# Patient Record
Sex: Male | Born: 1955
Health system: Southern US, Community
[De-identification: ages and names within clinical notes are randomized; demographics above are authoritative.]

## PROBLEM LIST (undated history)

## (undated) DIAGNOSIS — C801 Malignant (primary) neoplasm, unspecified: Secondary | ICD-10-CM

## (undated) DIAGNOSIS — F32A Depression, unspecified: Secondary | ICD-10-CM

## (undated) DIAGNOSIS — M1990 Unspecified osteoarthritis, unspecified site: Secondary | ICD-10-CM

## (undated) DIAGNOSIS — F329 Major depressive disorder, single episode, unspecified: Secondary | ICD-10-CM

## (undated) DIAGNOSIS — E785 Hyperlipidemia, unspecified: Secondary | ICD-10-CM

## (undated) DIAGNOSIS — Z973 Presence of spectacles and contact lenses: Secondary | ICD-10-CM

## (undated) HISTORY — PX: OTHER SURGICAL HISTORY: SHX169

## (undated) HISTORY — PX: TONSILLECTOMY: SUR1361

## (undated) HISTORY — PX: COLONOSCOPY: SHX174

---

## 1999-08-27 HISTORY — PX: APPENDECTOMY: SHX54

## 2000-01-29 ENCOUNTER — Encounter: Payer: Self-pay | Admitting: Emergency Medicine

## 2000-01-29 ENCOUNTER — Inpatient Hospital Stay (HOSPITAL_COMMUNITY): Admission: EM | Admit: 2000-01-29 | Discharge: 2000-02-03 | Payer: Self-pay | Admitting: Emergency Medicine

## 2000-01-29 ENCOUNTER — Encounter: Payer: Self-pay | Admitting: Surgery

## 2000-02-01 ENCOUNTER — Encounter: Payer: Self-pay | Admitting: Surgery

## 2000-02-26 ENCOUNTER — Ambulatory Visit (HOSPITAL_COMMUNITY): Admission: RE | Admit: 2000-02-26 | Discharge: 2000-02-26 | Payer: Self-pay | Admitting: Surgery

## 2000-02-26 ENCOUNTER — Encounter: Payer: Self-pay | Admitting: Surgery

## 2000-04-30 ENCOUNTER — Inpatient Hospital Stay (HOSPITAL_COMMUNITY): Admission: EM | Admit: 2000-04-30 | Discharge: 2000-05-02 | Payer: Self-pay | Admitting: Emergency Medicine

## 2000-04-30 ENCOUNTER — Encounter (INDEPENDENT_AMBULATORY_CARE_PROVIDER_SITE_OTHER): Payer: Self-pay | Admitting: Specialist

## 2000-04-30 ENCOUNTER — Encounter: Payer: Self-pay | Admitting: Surgery

## 2000-06-16 ENCOUNTER — Emergency Department (HOSPITAL_COMMUNITY): Admission: EM | Admit: 2000-06-16 | Discharge: 2000-06-16 | Payer: Self-pay

## 2001-08-26 HISTORY — PX: MASS EXCISION: SHX2000

## 2002-01-04 ENCOUNTER — Ambulatory Visit (HOSPITAL_BASED_OUTPATIENT_CLINIC_OR_DEPARTMENT_OTHER): Admission: RE | Admit: 2002-01-04 | Discharge: 2002-01-04 | Payer: Self-pay | Admitting: Surgery

## 2002-01-04 ENCOUNTER — Encounter (INDEPENDENT_AMBULATORY_CARE_PROVIDER_SITE_OTHER): Payer: Self-pay | Admitting: Specialist

## 2003-01-20 ENCOUNTER — Encounter: Payer: Self-pay | Admitting: Emergency Medicine

## 2003-01-20 ENCOUNTER — Emergency Department (HOSPITAL_COMMUNITY): Admission: EM | Admit: 2003-01-20 | Discharge: 2003-01-20 | Payer: Self-pay | Admitting: Emergency Medicine

## 2007-08-27 HISTORY — PX: SHOULDER ARTHROSCOPY W/ ROTATOR CUFF REPAIR: SHX2400

## 2007-11-09 ENCOUNTER — Ambulatory Visit: Payer: Self-pay

## 2007-11-09 ENCOUNTER — Encounter (INDEPENDENT_AMBULATORY_CARE_PROVIDER_SITE_OTHER): Payer: Self-pay | Admitting: Family Medicine

## 2007-11-27 ENCOUNTER — Encounter: Admission: RE | Admit: 2007-11-27 | Discharge: 2007-11-28 | Payer: Self-pay | Admitting: Family Medicine

## 2007-11-27 ENCOUNTER — Ambulatory Visit: Payer: Self-pay | Admitting: Psychology

## 2008-08-11 ENCOUNTER — Ambulatory Visit (HOSPITAL_BASED_OUTPATIENT_CLINIC_OR_DEPARTMENT_OTHER): Admission: RE | Admit: 2008-08-11 | Discharge: 2008-08-11 | Payer: Self-pay | Admitting: Orthopedic Surgery

## 2011-01-08 NOTE — Op Note (Signed)
NAME:  Robert Cook, Robert Cook NO.:  [id number]   MEDICAL RECORD NO.:  [id number]          PATIENT TYPE:  AMB   LOCATION:  DSC                          FACILITY:  [facility]   PHYSICIAN:  Katy Fitch. Sypher, M.D. DATE OF BIRTH:  Oct 14, 1955   DATE OF PROCEDURE:  08/11/2008  DATE OF DISCHARGE:                               OPERATIVE REPORT   PREOPERATIVE DIAGNOSES:  Chronic weakness of abduction, right shoulder  with obvious infraspinatus muscle atrophy.  An MRI evaluation of the  shoulder x2 suggesting musculotendinous junction injury to the  infraspinatus with retracted, atrophied infraspinatus muscle.  Also  acromioclavicular degenerative arthritis, subacromial bursitis, and  clinical evaluation suggesting mild adhesive capsulitis.   POSTOPERATIVE DIAGNOSES:  Mild adhesive capsulitis, acromioclavicular  arthropathy with unfavorable medial acromial and distal clavicle  morphology, chronic denervation of infraspinatus muscle due to possible  paralabral cyst or distal suprascapular nerve entrapment now greater  than 2 years duration.   OPERATION:  1. Examination of left shoulder under anesthesia demonstrating very      mild adhesive capsulitis.  2. Diagnostic arthroscopy, left shoulder documenting an intact rotator      cuff.  3. Arthroscopic subacromial debridement with decompression of medial      acromion, extensive bursectomy, and examination of bursal surface      of rotator cuff.  4. Arthroscopic distal clavicle resection.  5. Debridement of paralabral region from 1 o'clock to 10 o'clock of      left glenoid in an effort to be certain that there was no origin of      a paralabral cyst.   OPERATING SURGEON:  Katy Fitch. Sypher, MD   ASSISTANT:  Marveen Reeks Dasnoit, PA-C   ANESTHESIA:  General endotracheal supplemented by left interscalene  block.   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  Approximately 2 years ago, he noted marked weakness of the shoulder,  loss of Power of abduction, and atrophy of his infraspinatus muscle.  He  was evaluated by Dr. Jearld Adjutant, had an MRI that documented a  probable tear of the infraspinatus, and was scheduled for diagnostic  arthroscopy and repair of his rotator cuff.   Due to a possible cardiac arrhythmia, his surgery was cancelled, and  after cardiac workup because of scheduling issues, he did not proceed  with the planned surgery.  I subsequently repaired his wife's rotator  cuff, and during our conversation, he asked me to evaluate his shoulder  predicament.  By the time we began assessing his problem, he was more  than 18 months following his initial muscle atrophy and weakness onset.  Clinically, he appeared to have significant atrophy of the infraspinatus  and prior MRI had been interpreted to read a tear of the supraspinatus  and infraspinatus tendons with atrophy of the infraspinatus.  Given the  duration of time since his index injury and imaging, we repeated an MRI  of his shoulder in September 2009.  This was interpreted  by Dr.  Frederica Kuster of [facility] to reveal a musculotendinous junction  tear of the infraspinatus, atrophy and retraction of the infraspinatus,  a normal-appearing subscap supraspinatus and teres minor muscle belly,  and mild tendinopathy of the superior subscapularis and supraspinatus.  The long head of biceps was noted be degenerative and there was noted to  be a superior labral tear.  Dr. Suzie Portela could not identify a paralabral  cyst causing compression of the terminal branches of the suprascapular  nerve.  We advised Robert Cook to consider exploration of his shoulder  in an effort to determine whether or not his infraspinatus was  reparable.  Given the duration of time from the index diagnosis of at  this point  more than 2 years, the prognosis for muscle recovery is poor.  Our thought was that we might be able to perform a partial transfer of  the supraspinatus and teres minor to rebalance the shoulder in an effort  to facilitate stabilization of the glenohumeral joint.  Preoperatively,  he was advised of the potential risks and benefits of the surgery.  He  understood that we would make decisions intraoperatively as to how to  proceed.  Questions were invited and answered in detail.   PROCEDURE:  Robert Cook was brought to the operating room and placed  in the supine position on the operating table.   Due to penicillin allergy, preoperatively he was provided 1 g of  vancomycin in a slow drip to be delivered over 90 minutes.  He was  interviewed by Dr. Sampson Goon of the Anesthesia Service, and after  informed consent, had a left interscalene block placed without  complication.  He was brought to room 8 of the [facility],  placed in supine position on the operating table and under Dr.  Jarrett Ables direct supervision, general anesthesia by endotracheal  technique induced.  He was carefully positioned in the beach-chair  position with the aid of a torso and head holder designed for shoulder  arthroscopy.  The entire left upper extremity forequarter was prepped  with DuraPrep and draped with impervious arthroscopy drapes.  The scope  was introduced through a standard posterior viewing portal using a  switching stick from an anterior approach.  Diagnostic arthroscopy  revealed a degenerative biceps that was delaminating; a degenerative  labrum without a clearly unstable type 2 slap; an intact insertion of  the subscapularis, supraspinatus, infraspinatus, and teres minor; and a  moderate degree of mature adhesive capsulitis tissue anteriorly  obliterating the subscapularis recess and the anterior superior  glenohumeral ligament.  An anterior portal was created under direct  vision  followed by meticulous debridement of the biceps, deep surface of  the supraspinatus, anterior capsule, and tenolysis of the subscapularis.  The labrum was carefully examined with a nerve hook and found to have  degenerative features and some fibrillation at its anchor; however, the  labrum was fundamentally stable.  I could not identify the mouth or neck  of a paralabral cyst.  The scope was then placed in the anterior portal  and the posterior capsule, posterior labrum, and the superior recess  posteriorly were carefully examined.  There was no sign of a retracted  rotator cuff tear.  No sign of disruption of the musculotendinous  junction of the infraspinatus.  It was apparent that there was a  significant discrepancy between the MRI impression and our findings at  surgery.  The scope was then removed from the glenohumeral joint and  placed in subacromial space.  Florid bursitis was encountered that was  removed with suction shaver.  The [facility] joint was prominent with an  osteophyte at the distal clavicle and a prominent medial acromion.  The  medial acromion was leveled to type 1 morphology.  The distal clavicle  was resected with a suction shaver 15 mm.  The supraspinatus,  infraspinatus, and teres minor were inspected in the subacromial space  and there appeared to be some rough fibrillated areas of the  musculotendinous junction of the infraspinatus.  Direct inspection of  the muscle ultimately by placing the scope into the muscle revealed a  veal-type appearance of the infraspinatus suggesting that it was  chronically denervated and atrophic.  The supraspinatus and teres minor  appeared normal.  It appears that Robert Cook has had a chronic  denervation injury of his infraspinatus leading to marked atrophy and a  false positive MRI x2.  Given the circumstances, I return to the  glenohumeral joint and meticulously debrided above and below the labrum  in an effort to correct any  perilabral cyst origin from the degenerative  labrum, but stopped short of formally decompressing the suprascapular  nerve.  While some individuals will approach the suprascapular nerve  arthroscopically, I felt in view of his long term denervation, this was  not indicated.   I formed a plan to observe his shoulder for approximately 4-6 weeks and  then obtained nerve conduction and EMG testing to look for signs of  suprascapular nerve pathology.   It is possible at a later date a direct neurolysis would be indicated.  However, given the circumstances of his prolonged denervation, it is  unlikely that we will have adequate recovery of the infraspinatus  despite decompression of the suprascapular nerve.   Past experience with individuals who do not have an infraspinatus due to  either nerve injury or tumor surgery has taught [place] that we could  rehabilitate the shoulder without an infraspinatus muscle to excellent  function with a properly structured rehab program.   We will discuss this with Robert Cook in the postoperative period.   The arthroscopic equipment was removed after hemostasis and irrigation  of the joint and subacromial space.  The portals repaired with mattress  suture of 3-0 Prolene.  There were no apparent complications.      Katy Fitch Sypher, M.D.  Electronically Signed     RVS/MEDQ  D:  08/11/2008  T:  08/12/2008  Job:  [id number]

## 2011-01-11 NOTE — Discharge Summary (Signed)
[facility]  Patient:    Robert Cook, Robert Cook                MRN: [id number] Adm. Date:  [id number] Disc. Date: [id number] Attending:  Katha Cabal                           Discharge Summary  For full details of the admission history and physical, please see the handwritten legible history.  ADMISSION DIAGNOSIS:  Ruptured appendix with abscess.  DISCHARGE DIAGNOSIS:  Ruptured appendix with abscess, status post percutaneous aspiration and antibiotic treatment.  OPERATION:  Appendectomy.  HOSPITAL COURSE:  [name] was admitted on January 29, 2000, and was begun on IV antibiotics.  He was taken downstairs for CT scan, which was drained on February 01, 2000.  This was done with just aspiration.  The patient responded nicely to that.  His temperature was 99 degrees.  He was complaining of less pain.  He was ready for discharge on February 03, 2000.  DISCHARGE MEDICATIONS:  He was given Cipro and Flagyl.  FOLLOW-UP:  He would be followed up in the office. DD:  02/25/00 TD:  02/25/00 Job: [id number]

## 2011-01-11 NOTE — Op Note (Signed)
[place]  Patient:    Robert Cook, Robert Cook Visit Number: [id number] MRN: [id number]          Service Type: DSU Location: [facility] Attending Physician:  Katha Cabal Dictated by:   [place] Daphine Deutscher, M.D. Proc. Date: 01/04/02 Admit Date:  01/04/2002   CC:         Melvyn Neth D. Clovis Riley, MD  [place] Yetta Barre, M.D.   Operative Report  PREOPERATIVE DIAGNOSIS:  Mass, right neck, increasing in size.  POSTOPERATIVE DIAGNOSIS:  Probable lipoma.  OPERATION PERFORMED:  SURGEON:  Matthew B. Daphine Deutscher, M.D.  ANESTHESIA:  General.  DESCRIPTION OF PROCEDURE:  The patient was taken to room 2 at [facility] and placed in the left lateral decubitus position after intubation.  The neck was prepped with Betadine and draped sterilely.  I excised an elliptical incision along the skin line and then with gentle pressure, helped extrude a mass that appeared to be a fairly large lipomatous mass extended inferiorly in the neck.  I used blunt dissection to tease this away and did not actually transect any structures that appeared to be neurovascular.  The patient seemed to tolerate this procedure well.  Once excised, the Bovie was used to control a little bit of oozing and the wound was closed with 4-0 Vicryl subcutaneously and subcuticularly with benzoin and Steri-Strips on the skin.  The patient was given some Vicodin for pain and will be followed up in the office in three weeks.  Path pending on this mass.  Probable lipoma. Dictated by:   [place] Daphine Deutscher, M.D. Attending Physician:  Katha Cabal DD:  01/04/02 TD:  01/05/02 Job: [id number]

## 2011-01-11 NOTE — Op Note (Signed)
[place]  Patient:    Robert Cook, Robert Cook                MRN: [id number] Proc. Date: 04/30/00 Adm. Date:  [id number] Attending:  Katha Cabal                           Operative Report  PREOPERATIVE DIAGNOSIS:  History of ruptured appendix with abscess, now with recurrent appendicitis.  POSTOPERATIVE DIAGNOSIS:  Acute recurrent appendicitis.  OPERATION PERFORMED:  Appendectomy.  SURGEON:  Th[place] Park. Daphine Deutscher, M.D.  ANESTHESIA:  General.  DESCRIPTION OF PROCEDURE:  The patient was taken to the operating room 15 on the afternoon of April 30, 2000 and given general anesthesia.  In the emergency department he had been given Cipro and Flagyl.  The abdomen was prepped with Betadine and draped sterilely.  I made a small transverse incision in the right lower quadrant and did a muscle splitting incision.  I did not have to extend this in the [facility] fashion.  I then did cultures of peritoneal fluid and mobilized the cecum.  The appendix was bound in to this inflammatory mass and I mobilized the appendix or what was left of it. It seemed to be stuck to the terminal ileum which I dissected free.  I then was able to identify the appendiceal base and then went to the mesentery with a right angle clamp and ligated this with 2-0 silk.  After isolating the base, I then was able to bring the appendix up into the wound and took the TA30 stapler and stapled across the base.  This I did since the patient did have some purulence around the base of the appendix on the cecum, although the base itself was reasonably soft.  Following stapling, the appendix was transected and the stump did not bleed.  It looked like a good secure closure.  Following removal of the appendix I then irrigated copiously and used my finger to feel in the entire right lower quadrant.  I did retrieve about a 1.5 cm little intraperitoneal body which was probably related to  his previous abscess and drainage and probably necrotic omentum.  No bleeding was seen.  I then closed the abdomen using 0 PDS closing the peritoneum in a running simple fashion. The muscle was approximated with interrupted 0 PDS and the external oblique was closed with running 0 PDS.  The wound was irrigated and the skin was closed with vertical mattress sutures of 4-0 nylon.  The patient tolerated the procedure well and was taken to the recovery room in satisfactory condition. DD:  04/30/00 TD:  05/01/00 Job: [id number]

## 2011-05-02 ENCOUNTER — Other Ambulatory Visit: Payer: Self-pay | Admitting: Dermatology

## 2011-05-30 LAB — POCT HEMOGLOBIN-HEMACUE: Hemoglobin: 15.5 g/dL (ref 13.0–17.0)

## 2013-12-31 ENCOUNTER — Other Ambulatory Visit: Payer: Self-pay | Admitting: Orthopedic Surgery

## 2013-12-31 DIAGNOSIS — M75100 Unspecified rotator cuff tear or rupture of unspecified shoulder, not specified as traumatic: Secondary | ICD-10-CM

## 2014-01-10 ENCOUNTER — Ambulatory Visit
Admission: RE | Admit: 2014-01-10 | Discharge: 2014-01-10 | Disposition: A | Discharge status: Home / Self Care | Payer: BC Managed Care – PPO | Source: Ambulatory Visit | Attending: Orthopedic Surgery | Admitting: Orthopedic Surgery

## 2014-01-10 DIAGNOSIS — M75100 Unspecified rotator cuff tear or rupture of unspecified shoulder, not specified as traumatic: Secondary | ICD-10-CM

## 2014-01-10 MED ORDER — IOHEXOL 180 MG/ML  SOLN
15.0000 mL | Freq: Once | INTRAMUSCULAR | Status: AC | PRN
  Administered 2014-01-10: 15 mL via INTRA_ARTICULAR

## 2014-01-19 ENCOUNTER — Encounter (HOSPITAL_BASED_OUTPATIENT_CLINIC_OR_DEPARTMENT_OTHER): Payer: Self-pay | Admitting: *Deleted

## 2014-01-19 NOTE — Progress Notes (Signed)
To bring all meds and overnight bag in case he has to stay 

## 2014-01-19 NOTE — Progress Notes (Signed)
01/19/14 1649  OBSTRUCTIVE SLEEP APNEA  Have you ever been diagnosed with sleep apnea through a sleep study? No  Do you snore loudly (loud enough to be heard through closed doors)?  1  Do you often feel tired, fatigued, or sleepy during the daytime? 0  Has anyone observed you stop breathing during your sleep? 0  Do you have, or are you being treated for high blood pressure? 0  BMI more than 35 kg/m2? 0  Age over 59 years old? 1  Neck circumference greater than 40 cm/16 inches? 1  Gender: 1  Obstructive Sleep Apnea Score 4  Score 4 or greater  Results sent to PCP

## 2014-01-21 ENCOUNTER — Other Ambulatory Visit: Payer: Self-pay | Admitting: Orthopedic Surgery

## 2014-01-24 NOTE — H&P (Signed)
Robert Cook is an 58 y.o. male.   Chief Complaint: c/o chronic and progressive left shoulder pain and weakness. 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  He has a very complex left shoulder predicament with a chronic atrophy of the infraspinatus, a normal supraspinatus and a normal teres minor.  He was thoroughly worked up in 2009 and underwent arthroscopy at which time we directly examined the infraspinatus and found that it was denervated and atrophied.  We performed a subacromial decompression, partial distal clavicle resection and debridement of his posterior labrum.  Postoperatively he had relief of his impingement type symptoms, but never recovered the strength of his infraspinatus.  He has had weakness of scaption abduction  since that time with notable loss of external rotation strength.  He has visible atrophy of the infraspinatus.    In September, 2014 Robert Cook was worked up with a MRI of his shoulder.  His infraspinatus atrophy remained.  He had regrown bone at the distal clavicle and now has a rather prominent osteophyte superiorly at the [facility] joint. He does not have impinging osteophytes inferiorly at the distal clavicle, but has developed some recurrent bone at the medial acromion.    Robert Cook's symptoms are not directly at the [facility] joint.  He is having impingement type symptoms.  He has difficultly at night and during the day.      Past Medical History  Diagnosis Date  . Hyperlipemia   . Depression   . Anxiety   . Wears glasses     Past Surgical History  Procedure Laterality Date  . Shoulder arthroscopy w/ rotator cuff repair  2009    left  . Tonsillectomy    . Appendectomy  2001    ruptured append-open  . Mass excision  2003    right neck  . Colonoscopy      History reviewed. No pertinent family history. Social History:  reports that he quit smoking about a year ago. He does not have any smokeless  tobacco history on file. He reports that he does not drink alcohol or use illicit drugs.  Allergies:  Allergies  Allergen Reactions  . Penicillins Rash    No prescriptions prior to admission    No results found for this or any previous visit (from the past 48 hour(s)).  No results found.   Pertinent items are noted in HPI.  Height 5\' 8"  (1.727 m), weight 86.183 kg (190 lb).  General appearance: alert Head: Normocephalic, without obvious abnormality Neck: supple, symmetrical, trachea midline Resp: clear to auscultation bilaterally Cardio: WNL GI: normal findings: bowel sounds normal Extremities: His examination at this time reveals combined elevation right shoulder 170, left shoulder 160, he externally rotates the right shoulder 90 degrees at 90 degrees abduction, left shoulder 80 degrees at 90 degrees abduction.  He can internally rotate his right thumb to T-8, left thumb T-10. He has a painless push-off test on the right and a painless push-off test on the left.  He has some pain with cross-torso adduction. He is tender with firm palpation over the [facility] joint, but this is not his primary complaint.    I reviewed his MRI in detail from September, 2014.  This is a complex predicament that we have never really adequately explained with respect to his infraspinatus pathology.    He may have some type of fascial band compressing the distal subscapularis nerve motor branch to the infraspinatus.  Repeat MRI arthrogram of his left shoulder was ordered. This was obtained at [facility] on 01-10-14. The films were interpreted by Rozetta Nunnery. His MRI reveals the chronic complete atrophy of his infraspinatus and an intact infraspinatus tendon at the site of a prior rotator cuff repair. He now has an anterior full thickness supraspinatus rotator cuff tear. He also has new bone growth at the distal clavicle leading to a prominent osteophyte at the [facility] joint Pulses: 2+ and symmetric Skin:  normal Neurologic: Grossly normal    Assessment/Plan Impression:  Left shoulder impingement with RC tear.  Chronic acromioclavicular arthrosis   Plan: To the OR for left SA with SAD/DCR and RC repair as needed.The procedure, risks,benefits and post-op course were discussed with the patient at length and they were in agreement with the plan.  Robert Cook is a well-known family acquaintance and long-term patient.  He has a very complex predicament with his left rotator cuff that dates back before 2009. We have never been able to explain a severe atrophy of his infraspinatus muscle tendon unit. This was examined with direct arthroscopy in 2009 and at that time documented to be a completely denervated muscle with complete atrophy.  Robert Cook has been living with his supraspinatus as his primary stabilizer of the glenohumeral joint on the left side  He has developed a severe chronic pain syndrome and a recent MR arthrogram completed at [place] confirmed a significant spraspinatus rotator cuff tear  His infraspinatus appeared nearly normal at its insertion however there is no muscle animating the infraspinatus tendon  Robert Cook has been living with a chronic pain predicament due to his supraspinatus impairment with the supraspinatus providing most of the superior stabilization of his glenohumeral joint.  He has required narcotic analgesics to help sleep at night. This may complicate our postoperative pain management as he may be somewhat tolerant to opioids.  He understands were proceeding at this time on a best effort basis to try to relieve his pain with further subacromial decompression, open distal clavicle resection with muscle interposition and repair of the supraspinatus  There is a small chance we'll need to address his subscapularis as personal review of the MRI suggested superior subscapularis is also partially torn with some medial subluxation of the long head of the  biceps we will provide appropriate care for the predicament defined upon direct inspection of the glenohumeral joint as well as the subacromial space diagnostic arthroscopy.  Questions have been invited and answered in detail in the office in the holding area.     Marily Lente Dasnoit 01/24/2014, 3:55 PM

## 2014-01-25 ENCOUNTER — Ambulatory Visit (HOSPITAL_BASED_OUTPATIENT_CLINIC_OR_DEPARTMENT_OTHER): Payer: BC Managed Care – PPO | Admitting: Anesthesiology

## 2014-01-25 ENCOUNTER — Ambulatory Visit (HOSPITAL_BASED_OUTPATIENT_CLINIC_OR_DEPARTMENT_OTHER)
Admission: RE | Admit: 2014-01-25 | Discharge: 2014-01-26 | Disposition: A | Discharge status: Home / Self Care | Payer: BC Managed Care – PPO | Source: Ambulatory Visit | Attending: Orthopedic Surgery | Admitting: Orthopedic Surgery

## 2014-01-25 ENCOUNTER — Encounter (HOSPITAL_BASED_OUTPATIENT_CLINIC_OR_DEPARTMENT_OTHER): Payer: BC Managed Care – PPO | Admitting: Anesthesiology

## 2014-01-25 ENCOUNTER — Encounter (HOSPITAL_BASED_OUTPATIENT_CLINIC_OR_DEPARTMENT_OTHER): Payer: Self-pay

## 2014-01-25 ENCOUNTER — Encounter (HOSPITAL_BASED_OUTPATIENT_CLINIC_OR_DEPARTMENT_OTHER)
Admission: RE | Disposition: A | Discharge status: Home / Self Care | Payer: Self-pay | Source: Ambulatory Visit | Attending: Orthopedic Surgery

## 2014-01-25 DIAGNOSIS — M719 Bursopathy, unspecified: Principal | ICD-10-CM | POA: Insufficient documentation

## 2014-01-25 DIAGNOSIS — M19019 Primary osteoarthritis, unspecified shoulder: Secondary | ICD-10-CM | POA: Insufficient documentation

## 2014-01-25 DIAGNOSIS — F411 Generalized anxiety disorder: Secondary | ICD-10-CM | POA: Insufficient documentation

## 2014-01-25 DIAGNOSIS — Z87891 Personal history of nicotine dependence: Secondary | ICD-10-CM | POA: Insufficient documentation

## 2014-01-25 DIAGNOSIS — M7580 Other shoulder lesions, unspecified shoulder: Secondary | ICD-10-CM

## 2014-01-25 DIAGNOSIS — F329 Major depressive disorder, single episode, unspecified: Secondary | ICD-10-CM | POA: Insufficient documentation

## 2014-01-25 DIAGNOSIS — M6250 Muscle wasting and atrophy, not elsewhere classified, unspecified site: Secondary | ICD-10-CM | POA: Insufficient documentation

## 2014-01-25 DIAGNOSIS — M9420 Chondromalacia, unspecified site: Secondary | ICD-10-CM | POA: Insufficient documentation

## 2014-01-25 DIAGNOSIS — M67919 Unspecified disorder of synovium and tendon, unspecified shoulder: Secondary | ICD-10-CM | POA: Insufficient documentation

## 2014-01-25 DIAGNOSIS — Z880 Allergy status to penicillin: Secondary | ICD-10-CM | POA: Insufficient documentation

## 2014-01-25 DIAGNOSIS — M75120 Complete rotator cuff tear or rupture of unspecified shoulder, not specified as traumatic: Secondary | ICD-10-CM | POA: Diagnosis present

## 2014-01-25 DIAGNOSIS — S43439A Superior glenoid labrum lesion of unspecified shoulder, initial encounter: Secondary | ICD-10-CM | POA: Insufficient documentation

## 2014-01-25 DIAGNOSIS — M6790 Unspecified disorder of synovium and tendon, unspecified site: Secondary | ICD-10-CM | POA: Insufficient documentation

## 2014-01-25 DIAGNOSIS — M25819 Other specified joint disorders, unspecified shoulder: Secondary | ICD-10-CM | POA: Insufficient documentation

## 2014-01-25 DIAGNOSIS — F3289 Other specified depressive episodes: Secondary | ICD-10-CM | POA: Insufficient documentation

## 2014-01-25 DIAGNOSIS — E785 Hyperlipidemia, unspecified: Secondary | ICD-10-CM | POA: Insufficient documentation

## 2014-01-25 HISTORY — DX: Presence of spectacles and contact lenses: Z97.3

## 2014-01-25 HISTORY — PX: SHOULDER ARTHROSCOPY WITH OPEN ROTATOR CUFF REPAIR AND DISTAL CLAVICLE ACROMINECTOMY: SHX5683

## 2014-01-25 HISTORY — DX: Depression, unspecified: F32.A

## 2014-01-25 HISTORY — DX: Major depressive disorder, single episode, unspecified: F32.9

## 2014-01-25 HISTORY — DX: Hyperlipidemia, unspecified: E78.5

## 2014-01-25 LAB — POCT HEMOGLOBIN-HEMACUE: Hemoglobin: 14.3 g/dL (ref 13.0–17.0)

## 2014-01-25 SURGERY — SHOULDER ARTHROSCOPY WITH OPEN ROTATOR CUFF REPAIR AND DISTAL CLAVICLE ACROMINECTOMY
Anesthesia: General | Site: Shoulder | Laterality: Left

## 2014-01-25 MED ORDER — HYDROMORPHONE HCL PF 1 MG/ML IJ SOLN
0.2500 mg | INTRAMUSCULAR | Status: DC | PRN
  Administered 2014-01-25 (×2): 0.5 mg via INTRAVENOUS

## 2014-01-25 MED ORDER — OXYCODONE-ACETAMINOPHEN 5-325 MG PO TABS
1.0000 | ORAL_TABLET | ORAL | Status: DC | PRN
  Administered 2014-01-25 – 2014-01-26 (×4): 2 via ORAL
  Filled 2014-01-25 (×4): qty 2

## 2014-01-25 MED ORDER — MIDAZOLAM HCL 2 MG/2ML IJ SOLN
1.0000 mg | INTRAMUSCULAR | Status: DC | PRN
  Administered 2014-01-25: 2 mg via INTRAVENOUS

## 2014-01-25 MED ORDER — ONDANSETRON HCL 4 MG/2ML IJ SOLN: 4.0000 mg | Freq: Four times a day (QID) | INTRAMUSCULAR | Status: DC | PRN

## 2014-01-25 MED ORDER — VANCOMYCIN HCL IN DEXTROSE 1-5 GM/200ML-% IV SOLN
1000.0000 mg | Freq: Two times a day (BID) | INTRAVENOUS | Status: AC
  Administered 2014-01-26: 1000 mg via INTRAVENOUS

## 2014-01-25 MED ORDER — VANCOMYCIN HCL IN DEXTROSE 1-5 GM/200ML-% IV SOLN
1000.0000 mg | INTRAVENOUS | Status: AC
  Administered 2014-01-25 (×2): 1000 mg via INTRAVENOUS

## 2014-01-25 MED ORDER — SODIUM CHLORIDE 0.9 % IR SOLN
Status: DC | PRN
  Administered 2014-01-25: 10000 mL

## 2014-01-25 MED ORDER — HYDROMORPHONE HCL 2 MG PO TABS: ORAL_TABLET | ORAL | Status: DC

## 2014-01-25 MED ORDER — PHENYLEPHRINE HCL 10 MG/ML IJ SOLN
10.0000 mg | INTRAVENOUS | Status: DC | PRN
  Administered 2014-01-25: 20 ug/min via INTRAVENOUS

## 2014-01-25 MED ORDER — LACTATED RINGERS IV SOLN
INTRAVENOUS | Status: DC
  Administered 2014-01-25 (×2): via INTRAVENOUS

## 2014-01-25 MED ORDER — DEXAMETHASONE SODIUM PHOSPHATE 4 MG/ML IJ SOLN
INTRAMUSCULAR | Status: DC | PRN
  Administered 2014-01-25: 10 mg via INTRAVENOUS

## 2014-01-25 MED ORDER — ONDANSETRON HCL 4 MG/2ML IJ SOLN: 4.0000 mg | Freq: Once | INTRAMUSCULAR | Status: DC | PRN

## 2014-01-25 MED ORDER — SUCCINYLCHOLINE CHLORIDE 20 MG/ML IJ SOLN
INTRAMUSCULAR | Status: DC | PRN
  Administered 2014-01-25: 100 mg via INTRAVENOUS

## 2014-01-25 MED ORDER — ONDANSETRON HCL 4 MG PO TABS: 4.0000 mg | ORAL_TABLET | Freq: Four times a day (QID) | ORAL | Status: DC | PRN

## 2014-01-25 MED ORDER — DOXYCYCLINE HYCLATE 100 MG PO TABS: 100.0000 mg | ORAL_TABLET | Freq: Two times a day (BID) | ORAL | Status: DC

## 2014-01-25 MED ORDER — FENTANYL CITRATE 0.05 MG/ML IJ SOLN
INTRAMUSCULAR | Status: AC
  Filled 2014-01-25: qty 6

## 2014-01-25 MED ORDER — LIDOCAINE HCL (CARDIAC) 20 MG/ML IV SOLN
INTRAVENOUS | Status: DC | PRN
  Administered 2014-01-25: 100 mg via INTRAVENOUS

## 2014-01-25 MED ORDER — PROPOFOL 10 MG/ML IV BOLUS
INTRAVENOUS | Status: DC | PRN
  Administered 2014-01-25: 200 mg via INTRAVENOUS

## 2014-01-25 MED ORDER — EPHEDRINE SULFATE 50 MG/ML IJ SOLN
INTRAMUSCULAR | Status: DC | PRN
  Administered 2014-01-25: 15 mg via INTRAVENOUS

## 2014-01-25 MED ORDER — CHLORHEXIDINE GLUCONATE 4 % EX LIQD: 60.0000 mL | Freq: Once | CUTANEOUS | Status: DC

## 2014-01-25 MED ORDER — MIDAZOLAM HCL 2 MG/2ML IJ SOLN
INTRAMUSCULAR | Status: AC
  Filled 2014-01-25: qty 2

## 2014-01-25 MED ORDER — HYDROMORPHONE HCL PF 1 MG/ML IJ SOLN
0.5000 mg | INTRAMUSCULAR | Status: DC | PRN
  Administered 2014-01-25 – 2014-01-26 (×5): 1 mg via INTRAVENOUS
  Filled 2014-01-25 (×5): qty 1

## 2014-01-25 MED ORDER — OLANZAPINE 10 MG PO TABS: 20.0000 mg | ORAL_TABLET | Freq: Every day | ORAL | Status: DC

## 2014-01-25 MED ORDER — METOCLOPRAMIDE HCL 5 MG/ML IJ SOLN: 5.0000 mg | Freq: Three times a day (TID) | INTRAMUSCULAR | Status: DC | PRN

## 2014-01-25 MED ORDER — MIDAZOLAM HCL 5 MG/5ML IJ SOLN
INTRAMUSCULAR | Status: DC | PRN
  Administered 2014-01-25: 2 mg via INTRAVENOUS

## 2014-01-25 MED ORDER — ONDANSETRON HCL 4 MG/2ML IJ SOLN
INTRAMUSCULAR | Status: DC | PRN
Start: 2014-01-25 — End: 2014-01-25
  Administered 2014-01-25: 4 mg via INTRAVENOUS

## 2014-01-25 MED ORDER — OXYCODONE HCL 5 MG PO TABS: 5.0000 mg | ORAL_TABLET | Freq: Once | ORAL | Status: DC | PRN

## 2014-01-25 MED ORDER — FENTANYL CITRATE 0.05 MG/ML IJ SOLN
INTRAMUSCULAR | Status: AC
  Filled 2014-01-25: qty 2

## 2014-01-25 MED ORDER — VANCOMYCIN HCL IN DEXTROSE 1-5 GM/200ML-% IV SOLN
INTRAVENOUS | Status: AC
  Filled 2014-01-25: qty 200

## 2014-01-25 MED ORDER — SODIUM CHLORIDE 0.9 % IV SOLN
INTRAVENOUS | Status: DC
  Administered 2014-01-25: 22:00:00 via INTRAVENOUS

## 2014-01-25 MED ORDER — METOCLOPRAMIDE HCL 5 MG PO TABS: 5.0000 mg | ORAL_TABLET | Freq: Three times a day (TID) | ORAL | Status: DC | PRN

## 2014-01-25 MED ORDER — FENTANYL CITRATE 0.05 MG/ML IJ SOLN
INTRAMUSCULAR | Status: DC | PRN
  Administered 2014-01-25: 100 ug via INTRAVENOUS
  Administered 2014-01-25 (×2): 50 ug via INTRAVENOUS

## 2014-01-25 MED ORDER — FENTANYL CITRATE 0.05 MG/ML IJ SOLN
50.0000 ug | INTRAMUSCULAR | Status: DC | PRN
  Administered 2014-01-25: 100 ug via INTRAVENOUS

## 2014-01-25 MED ORDER — OXYCODONE HCL 5 MG/5ML PO SOLN: 5.0000 mg | Freq: Once | ORAL | Status: DC | PRN

## 2014-01-25 MED ORDER — HYDROMORPHONE HCL PF 1 MG/ML IJ SOLN
INTRAMUSCULAR | Status: AC
  Filled 2014-01-25: qty 1

## 2014-01-25 SURGICAL SUPPLY — 81 items
ANCHOR BIO SWLOCK 4.75 W/TIG (Anchor) ×2 IMPLANT
ANCHOR PEEK 4.75X19.1 SWLK C (Anchor) ×6 IMPLANT
BANDAGE ADH SHEER 1  50/CT (GAUZE/BANDAGES/DRESSINGS) IMPLANT
BLADE 15 SAFETY STRL DISP (BLADE) IMPLANT
BLADE AVERAGE 25X9 (BLADE) ×2 IMPLANT
BLADE CUTTER MENIS 5.5 (BLADE) IMPLANT
BLADE SURG 15 STRL LF DISP TIS (BLADE) ×1 IMPLANT
BLADE SURG 15 STRL SS (BLADE) ×1
BUR EGG 3PK/BX (BURR) IMPLANT
BUR OVAL 6.0 (BURR) ×2 IMPLANT
CANISTER SUCT 3000ML (MISCELLANEOUS) IMPLANT
CANNULA TWIST IN 8.25X7CM (CANNULA) ×2 IMPLANT
CLEANER CAUTERY TIP 5X5 PAD (MISCELLANEOUS) ×1 IMPLANT
CUTTER MENISCUS  4.2MM (BLADE) ×1
CUTTER MENISCUS 4.2MM (BLADE) ×1 IMPLANT
DECANTER SPIKE VIAL GLASS SM (MISCELLANEOUS) IMPLANT
DRAPE INCISE IOBAN 66X45 STRL (DRAPES) ×2 IMPLANT
DRAPE STERI 35X30 U-POUCH (DRAPES) ×2 IMPLANT
DRAPE SURG 17X23 STRL (DRAPES) ×2 IMPLANT
DRAPE U-SHAPE 47X51 STRL (DRAPES) ×2 IMPLANT
DRAPE U-SHAPE 76X120 STRL (DRAPES) ×4 IMPLANT
DRSG PAD ABDOMINAL 8X10 ST (GAUZE/BANDAGES/DRESSINGS) ×2 IMPLANT
DURAPREP 26ML APPLICATOR (WOUND CARE) ×2 IMPLANT
ELECT REM PT RETURN 9FT ADLT (ELECTROSURGICAL) ×2
ELECTRODE REM PT RTRN 9FT ADLT (ELECTROSURGICAL) ×1 IMPLANT
GAUZE SPONGE 4X4 12PLY STRL (GAUZE/BANDAGES/DRESSINGS) ×2 IMPLANT
GLOVE BIO SURGEON STRL SZ 6.5 (GLOVE) ×2 IMPLANT
GLOVE BIOGEL M 7.0 STRL (GLOVE) ×2 IMPLANT
GLOVE BIOGEL M STRL SZ7.5 (GLOVE) ×2 IMPLANT
GLOVE BIOGEL PI IND STRL 7.0 (GLOVE) ×2 IMPLANT
GLOVE BIOGEL PI IND STRL 7.5 (GLOVE) ×1 IMPLANT
GLOVE BIOGEL PI IND STRL 8 (GLOVE) ×2 IMPLANT
GLOVE BIOGEL PI INDICATOR 7.0 (GLOVE) ×2
GLOVE BIOGEL PI INDICATOR 7.5 (GLOVE) ×1
GLOVE BIOGEL PI INDICATOR 8 (GLOVE) ×2
GLOVE ORTHO TXT STRL SZ7.5 (GLOVE) ×2 IMPLANT
GOWN STRL REUS W/ TWL LRG LVL3 (GOWN DISPOSABLE) ×1 IMPLANT
GOWN STRL REUS W/TWL LRG LVL3 (GOWN DISPOSABLE) ×1
GOWN STRL REUS W/TWL XL LVL4 (GOWN DISPOSABLE) ×4 IMPLANT
IV NS IRRIG 3000ML ARTHROMATIC (IV SOLUTION) ×8 IMPLANT
MANIFOLD NEPTUNE II (INSTRUMENTS) ×2 IMPLANT
NDL SUT 6 .5 CRC .975X.05 MAYO (NEEDLE) ×1 IMPLANT
NEEDLE MAYO TAPER (NEEDLE) ×1
NEEDLE MINI RC 24MM (NEEDLE) IMPLANT
NEEDLE SCORPION (NEEDLE) ×2 IMPLANT
PACK ARTHROSCOPY DSU (CUSTOM PROCEDURE TRAY) ×2 IMPLANT
PACK BASIN DAY SURGERY FS (CUSTOM PROCEDURE TRAY) ×2 IMPLANT
PAD CLEANER CAUTERY TIP 5X5 (MISCELLANEOUS) ×1
PASSER SUT SWANSON 36MM LOOP (INSTRUMENTS) IMPLANT
PENCIL BUTTON HOLSTER BLD 10FT (ELECTRODE) ×2 IMPLANT
SLEEVE SCD COMPRESS KNEE MED (MISCELLANEOUS) ×2 IMPLANT
SLING ARM LRG ADULT FOAM STRAP (SOFTGOODS) ×4 IMPLANT
SLING ARM MED ADULT FOAM STRAP (SOFTGOODS) IMPLANT
SPONGE LAP 4X18 X RAY DECT (DISPOSABLE) ×4 IMPLANT
STRIP CLOSURE SKIN 1/2X4 (GAUZE/BANDAGES/DRESSINGS) ×2 IMPLANT
SUCTION FRAZIER TIP 10 FR DISP (SUCTIONS) IMPLANT
SUT FIBERWIRE #2 38 T-5 BLUE (SUTURE)
SUT FIBERWIRE 3-0 18 TAPR NDL (SUTURE)
SUT PROLENE 1 CT (SUTURE) IMPLANT
SUT PROLENE 3 0 PS 2 (SUTURE) ×2 IMPLANT
SUT TIGER TAPE 7 IN WHITE (SUTURE) IMPLANT
SUT VIC AB 0 CT1 27 (SUTURE)
SUT VIC AB 0 CT1 27XBRD ANBCTR (SUTURE) IMPLANT
SUT VIC AB 0 SH 27 (SUTURE) ×4 IMPLANT
SUT VIC AB 2-0 SH 27 (SUTURE) ×2
SUT VIC AB 2-0 SH 27XBRD (SUTURE) ×2 IMPLANT
SUT VIC AB 3-0 SH 27 (SUTURE)
SUT VIC AB 3-0 SH 27X BRD (SUTURE) IMPLANT
SUT VIC AB 3-0 X1 27 (SUTURE) IMPLANT
SUTURE FIBERWR #2 38 T-5 BLUE (SUTURE) IMPLANT
SUTURE FIBERWR 3-0 18 TAPR NDL (SUTURE) IMPLANT
SYR 3ML 23GX1 SAFETY (SYRINGE) IMPLANT
SYR BULB 3OZ (MISCELLANEOUS) IMPLANT
TAPE FIBER 2MM 7IN #2 BLUE (SUTURE) ×4 IMPLANT
TAPE PAPER 3X10 WHT MICROPORE (GAUZE/BANDAGES/DRESSINGS) ×2 IMPLANT
TOWEL OR 17X24 6PK STRL BLUE (TOWEL DISPOSABLE) ×2 IMPLANT
TUBE CONNECTING 20X1/4 (TUBING) ×4 IMPLANT
TUBING ARTHROSCOPY IRRIG 16FT (MISCELLANEOUS) ×2 IMPLANT
WAND STAR VAC 90 (SURGICAL WAND) ×2 IMPLANT
WATER STERILE IRR 1000ML POUR (IV SOLUTION) ×2 IMPLANT
YANKAUER SUCT BULB TIP NO VENT (SUCTIONS) IMPLANT

## 2014-01-25 NOTE — Anesthesia Postprocedure Evaluation (Signed)
  Anesthesia Post-op Note  Patient: Robert Cook  Procedure(s) Performed: Procedure(s): LEFT SHOULDER ARTHROSCOPY WITH OPEN  DISTAL CLAVICLE RESECTION, OPEN ROTATOR CUFF REPAIR, OPEN BICEPS TENODESIS, ARTHROSCOPIC SUBSCAPULARIS REPAIR AND SUBACROMIAL DECOMPRESSION (Left)  Patient Location: PACU  Anesthesia Type:General  Level of Consciousness: awake  Airway and Oxygen Therapy: Patient Spontanous Breathing  Post-op Pain: mild  Post-op Assessment: Post-op Vital signs reviewed  Post-op Vital Signs: Reviewed  Last Vitals:  Filed Vitals:   01/25/14 1430  BP: 113/70  Pulse: 70  Temp: 36.1 C  Resp: 18    Complications: No apparent anesthesia complications

## 2014-01-25 NOTE — Transfer of Care (Signed)
Immediate Anesthesia Transfer of Care Note  Patient: Robert Cook  Procedure(s) Performed: Procedure(s): LEFT SHOULDER ARTHROSCOPY WITH OPEN  DISTAL CLAVICLE RESECTION, OPEN ROTATOR CUFF REPAIR, OPEN BICEPS TENODESIS, ARTHROSCOPIC SUBSCAPULARIS REPAIR AND SUBACROMIAL DECOMPRESSION (Left)  Patient Location: PACU  Anesthesia Type:General  Level of Consciousness: awake, alert , oriented and patient cooperative  Airway & Oxygen Therapy: Patient Spontanous Breathing and Patient connected to face mask oxygen  Post-op Assessment: Report given to PACU RN and Post -op Vital signs reviewed and stable  Post vital signs: Reviewed and stable  Complications: No apparent anesthesia complications

## 2014-01-25 NOTE — Progress Notes (Signed)
Assisted Dr. Crews with left, ultrasound guided, interscalene  block. Side rails up, monitors on throughout procedure. See vital signs in flow sheet. Tolerated Procedure well. 

## 2014-01-25 NOTE — Brief Op Note (Signed)
01/25/2014  1:05 PM  PATIENT:  Robert Cook  59 y.o. male  PRE-OPERATIVE DIAGNOSIS:  AC DEGENERATIVE JOINT DISEASE AND ROTATOR CUFF TEAR LEFT SHOULDER  POST-OPERATIVE DIAGNOSIS:  AC DEGENERATIVE JOINT DISEASE AND ROTATOR CUFF TEAR LEFT SHOULDER,  TYPE 3 SLAP LESION WITH UNSTABLE BICEPS LONG HEAD  PROCEDURE:  Procedure(s): LEFT SHOULDER ARTHROSCOPY WITH OPEN  DISTAL CLAVICLE RESECTION, OPEN ROTATOR CUFF REPAIR, OPEN BICEPS TENODESIS, ARTHROSCOPIC SUBSCAPULARIS REPAIR AND SUBACROMIAL DECOMPRESSION (Left)  SURGEON:  Surgeon(s) and Role:    * Cammie Sickle, MD - Primary  PHYSICIAN ASSISTANT:   ASSISTANTS: Kathyrn Sheriff.A-C    ANESTHESIA:   general  EBL:  Total I/O In: 1000 [I.V.:1000] Out: -   BLOOD ADMINISTERED:none  DRAINS: none   LOCAL MEDICATIONS USED:  .5% ROPIVACAINE SCALENE BLOCK  SPECIMEN:  No Specimen  DISPOSITION OF SPECIMEN:  N/A  COUNTS:  YES  TOURNIQUET:  * No tourniquets in log *  DICTATION: .Other Dictation: Dictation Number [id number]  PLAN OF CARE: Admit for overnight observation  PATIENT DISPOSITION:  PACU - hemodynamically stable.   Delay start of Pharmacological VTE agent (>24hrs) due to surgical blood loss or risk of bleeding: not applicable

## 2014-01-25 NOTE — Discharge Instructions (Addendum)
[facility] Instructions °Hand Surgery ° °Wound Care: °Keep your hand elevated above the level of your heart.  Do not allow it to dangle by your side.  Keep the dressing dry and do not remove it unless your doctor advises you to do so.  He will usually change it at the time of your post-op visit.  Moving your fingers is advised to stimulate circulation but will depend on the site of your surgery.  If you have a splint applied, your doctor will advise you regarding movement. ° °Activity: °Do not drive or operate machinery today.  Rest today and then you may return to your normal activity and work as indicated by your physician. ° °Diet:  °Drink liquids today or eat a light diet.  You may resume a regular diet tomorrow.   ° °General expectations: °Pain for two to three days. °Fingers may become slightly swollen. ° °Call your doctor if any of the following occur: °Severe pain not relieved by pain medication. °Elevated temperature. °Dressing soaked with blood. °Inability to move fingers. °White or bluish color to fingers. ° ° °Regional Anesthesia Blocks ° °1. Numbness or the inability to move the "blocked" extremity may last from 3-48 hours after placement. The length of time depends on the medication injected and your individual response to the medication. If the numbness is not going away after 48 hours, call your surgeon. ° °2. The extremity that is blocked will need to be protected until the numbness is gone and the  Strength has returned. Because you cannot feel it, you will need to take extra care to avoid injury. Because it may be weak, you may have difficulty moving it or using it. You may not know what position it is in without looking at it while the block is in effect. ° °3. For blocks in the legs and feet, returning to weight bearing and walking needs to be done carefully. You will need to wait until the numbness is entirely gone and the strength has returned. You should be able to move your leg and foot  normally before you try and bear weight or walk. You will need someone to be with you when you first try to ensure you do not fall and possibly risk injury. ° °4. Bruising and tenderness at the needle site are common side effects and will resolve in a few days. ° °5. Persistent numbness or new problems with movement should be communicated to the surgeon or the [facility] ([phone])/ [facility] ([phone]). ° ° °Post Anesthesia Home Care Instructions ° °Activity: °Get plenty of rest for the remainder of the day. A responsible adult should stay with you for 24 hours following the procedure.  °For the next 24 hours, DO NOT: °-Drive a car °-Operate machinery °-Drink alcoholic beverages °-Take any medication unless instructed by your physician °-Make any legal decisions or sign important papers. ° °Meals: °Start with liquid foods such as gelatin or soup. Progress to regular foods as tolerated. Avoid greasy, spicy, heavy foods. If nausea and/or vomiting occur, drink only clear liquids until the nausea and/or vomiting subsides. Call your physician if vomiting continues. ° °Special Instructions/Symptoms: °Your throat may feel dry or sore from the anesthesia or the breathing tube placed in your throat during surgery. If this causes discomfort, gargle with warm salt water. The discomfort should disappear within 24 hours. ° °

## 2014-01-25 NOTE — Anesthesia Preprocedure Evaluation (Addendum)
Anesthesia Evaluation  Patient identified by MRN, date of birth, ID band Patient awake    Reviewed: Allergy & Precautions, H&P , NPO status , Patient's Chart, lab work & pertinent test results  Airway Mallampati: I TM Distance: >3 FB Neck ROM: Full    Dental  (+) Teeth Intact, Dental Advisory Given   Pulmonary former smoker,  breath sounds clear to auscultation        Cardiovascular Exercise Tolerance: Good negative cardio ROS  Rhythm:Regular Rate:Normal  [facility] [address] [place], [place] [address]        [phone]    ---------------------------------------------------------------   Transthoracic Echocardiogram  Patient: Robert Cook MR Number: Study Date: 09-Nov-2007    ---------------------------------------------------------------  Gender:  Age:  DOB:  Height:  Weight:  BSA:  Pt. Status:  Room:    STAFF  PERFORMING   Office [place]  ORDERING     [place]    [facility] [place]    ---------------------------------------------------------------   INDICATIONS:  Assess left ventricular function. Evaluate for regional wall motion  abnormalities. Evaluate suspected valvular heart disease.  Preoperative evaluation.   DIAGNOSES SUPPORTING MEDICAL NECESSITY:  Abnormal EKG 794.31 PVCs 427.69   HISTORY:  Patient noted to have asymptomatic bradycardia with runs of bigeminy  and frequent PVCs during preoperative evaluation for surgery. The  patient had family history of coronary artery disease and cigarette  use (greater than one month prior to this admission). Abnormal  ECG-Frequent PVCs and occasional runs of bigeminy.    ---------------------------------------------------------------   PROCEDURE INFORMATION:  A transthoracic complete 2D study was performed. Additional  evaluation included M-mode, complete spectral Doppler, and color  Doppler. This was a routine echocardiographic study. The study was  performed in the [facility]. Image quality was adequate.    ---------------------------------------------------------------   2D Measurements  LEFT VENTRICLE                      NORMAL  LVID ed (chordal)     42    mm      36-56 mm  LVID es (chordal)     31    mm      --  FS (chordal)          26    %       28-44%  IVS ed                11    mm      6-11 mm  LVPW ed               9     mm      6-11 mm  AORTA                               NORMAL  AoD (root)            36    mm      <70mm  LEFT ATRIUM                         NORMAL  LAD                   40    mm      19-49mm  LAD index (A-P)       2     cm/m^2  <2.2 cm/m^2   Doppler measurements  MITRAL VALVE                        NORMAL  Peak E velocity       83.3  cm/sec  --  Peak A velocity       46.3  cm/sec  --  MV peak E/A           1.8           --  MV deceleration time  183   msec    150-230 msec  Peak gradient         3     mmHg    --    ---------------------------------------------------------------   LEFT VENTRICLE:  -  Left ventricular size was normal.  -  Overall left ventricular systolic function was normal.  -  Left ventricular ejection fraction was estimated , range being 60        % to 65 %.  -  There were no left ventricular regional wall motion        abnormalities.  -  Left ventricular wall thickness was normal   Neuro/Psych PSYCHIATRIC DISORDERS Anxiety Depression    GI/Hepatic negative GI ROS, Neg liver ROS,   Endo/Other  negative endocrine ROS  Renal/GU negative Renal ROS     Musculoskeletal  (+) Arthritis -, Osteoarthritis,    Abdominal   Peds  Hematology negative hematology ROS (+)   Anesthesia Other Findings   Reproductive/Obstetrics                        Anesthesia Physical Anesthesia Plan  ASA: II  Anesthesia Plan:  General   Post-op Pain Management:    Induction: Intravenous  Airway Management Planned: Oral ETT  Additional Equipment:   Intra-op Plan:   Post-operative Plan: Extubation in OR  Informed Consent: I have reviewed the patients History and Physical, chart, labs and discussed the procedure including the risks, benefits and alternatives for the proposed anesthesia with the patient or authorized representative who has indicated his/her understanding and acceptance.   Dental advisory given  Plan Discussed with: CRNA, Anesthesiologist and Surgeon  Anesthesia Plan Comments:        Anesthesia Quick Evaluation

## 2014-01-25 NOTE — Anesthesia Procedure Notes (Addendum)
Anesthesia Regional Block:  Interscalene brachial plexus block  Pre-Anesthetic Checklist: ,, timeout performed, Correct Patient, Correct Site, Correct Laterality, Correct Procedure, Correct Position, site marked, Risks and benefits discussed,  Surgical consent,  Pre-op evaluation,  At surgeon's request and post-op pain management  Laterality: Left and Upper  Prep: chloraprep       Needles:  Injection technique: Single-shot  Needle Type: Echogenic Needle     Needle Length: 5cm 5 cm Needle Gauge: 21 and 21 G    Additional Needles:  Procedures: ultrasound guided (picture in chart) Interscalene brachial plexus block Narrative:  Start time: 01/25/2014 10:05 AM End time: 01/25/2014 10:11 AM Injection made incrementally with aspirations every 5 mL.  Performed by: Personally  Anesthesiologist: Lorrene Reid, MD   Procedure Name: Intubation Date/Time: 01/25/2014 10:44 AM Performed by: Wanita Chamberlain Pre-anesthesia Checklist: Patient identified, Timeout performed, Emergency Drugs available, Suction available and Patient being monitored Patient Re-evaluated:Patient Re-evaluated prior to inductionOxygen Delivery Method: Circle system utilized Preoxygenation: Pre-oxygenation with 100% oxygen Intubation Type: IV induction Ventilation: Mask ventilation without difficulty Laryngoscope Size: Mac and 3 Grade View: Grade I Tube type: Oral Tube size: 8.0 mm Number of attempts: 1 Airway Equipment and Method: Stylet Placement Confirmation: ETT inserted through vocal cords under direct vision,  breath sounds checked- equal and bilateral and positive ETCO2 Secured at: 22 cm Tube secured with: Tape Dental Injury: Teeth and Oropharynx as per pre-operative assessment

## 2014-01-25 NOTE — Op Note (Signed)
[id number] 

## 2014-01-26 MED ORDER — ROPIVACAINE HCL 5 MG/ML IJ SOLN
INTRAMUSCULAR | Status: DC | PRN
  Administered 2014-01-25: 22 mL via PERINEURAL

## 2014-01-26 MED ORDER — VANCOMYCIN HCL IN DEXTROSE 1-5 GM/200ML-% IV SOLN
INTRAVENOUS | Status: AC
  Filled 2014-01-26: qty 200

## 2014-01-26 NOTE — Addendum Note (Signed)
Addendum created 01/26/14 0746 by Napoleon Form, MD   Modules edited: Anesthesia Medication Administration

## 2014-01-26 NOTE — Addendum Note (Signed)
Addendum created 01/26/14 0745 by Aldona Bar   Modules edited: Charges VN

## 2014-01-26 NOTE — Op Note (Signed)
NAME:  Robert Cook,              ACCOUNT NO.:  [id number]  MEDICAL RECORD NO.:  [id number]  LOCATION:                                 FACILITY:  PHYSICIAN:  Youlanda Mighty. Aerianna Losey, M.D.      DATE OF BIRTH:  DATE OF PROCEDURE:  01/25/2014 DATE OF DISCHARGE:                              OPERATIVE REPORT   PREOPERATIVE DIAGNOSES:  Chronic left shoulder pain and weakness with background history of more than 7 years complete atrophy left infraspinatus muscle and new onset pain consistent with rotator cuff tear, status post MR arthrogram documenting a full-thickness supraspinatus rotator cuff tear.  POSTOPERATIVE DIAGNOSIS: 1. Full-thickness supraspinatus rotator cuff tear. 2. Grade 2 subscapularis retracting rotator cuff tear. 3. Type 3 SLAP lesion with unstable biceps origin. 4. Regrowth of distal clavicle leading to impingement and     acromioclavicular joint, status post prior subacromial     decompression of the distal clavicle resection performed in 2009.  OPERATION: 1. Diagnostic arthroscopy, left glenohumeral joint. 2. Open resection of distal centimeter of clavicle with repair of     trapezius and anterior third of deltoid, closing dead space created     by distal clavicle resection. 3. Arthroscopic evaluation of the glenohumeral joint with labral     debridement, biceps tenotomy, debridement of supraspinatus and     subscapularis rotator cuff tears. 4. Arthroscopic repair of subscapularis. 5. Open biceps tenodesis suprapectoral. 6. Repair of supraspinatus rotator cuff tendon with double row     technique utilizing a medial PEEK swivel lock and 2 lateral swivel     locks with double diamondback technique.  OPERATING SURGEON:  Youlanda Mighty. Ruston Fedora, M.D.  ASSISTANT:  Marily Lente Dasnoit, PA-C  ANESTHESIA:  General endotracheal supplemented by a ropivacaine preoperative interscalene block.  SUPERVISING ANESTHESIOLOGIST:  Lorrene Reid, M.D.  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  He had, had extensive care of a painful shoulder by Dr. Lajean Silvius and in 2009, sought a consultation regarding an atypical pain predicament involving his left shoulder.  We ultimately identified atrophy of his infraspinatus rotator cuff tendon, a large perilabral cyst of his left glenoid and degenerative arthritis of the AC joint.  We proceeded with arthroscopic evaluation of his left shoulder in December 2009, demonstrating mild adhesive capsulitis, he had an intact rotator cuff at that time.  We performed a subacromial decompression of the medial acromion bursectomy and arthroscopic resection of the distal clavicle. Mr. Stowers had a significant paralabral cyst from 1 o'clock to 10 o'clock of the left glenoid and this was extensively debrided.  We carefully examined the infraspinatus muscle directly with the arthroscope at that time and identified complete denervation and atrophy of the infraspinatus despite an intact rotator cuff.  Neither Dr. Eddie Dibbles nor myself was ever able to explain the etiology of the severe infraspinatus atrophy.  Mr. Cannedy went onto have several years of excellent pain relief, but then returned for followup evaluation in August 2014, reporting increasing left shoulder pain.  We at that time, noted signs of impingement, possible onset of rotator cuff tearing and sent him for  an MRI of the shoulder at [facility] on May 07, 2013.  At that time, he was noted to have advanced supraspinatus tendinosis.  No sign of retracted rotator cuff tear, recurrence of an osteophyte at the distal clavicle with arthrosis, considerable subdeltoid bursal fluid and infraspinatus muscle atrophy thought by the attending radiologist to be probably due to a sequela of Parsonage-Turner syndrome.  Mr. Forbush had subacromial steroid injection therapy and activity modification.   He was unable to become comfortable, became increasingly weak and impaired at work and with his household activities.  He was requiring narcotic analgesics to sleep comfortably at night, and therefore was sent for a followup MR arthrogram obtained on Jan 10, 2014, documenting an essentially full-thickness supraspinatus rotator cuff tear that was not visible on a plain MRI without intra-articular MRI dye.  The attending radiologist, Dr. Zigmund Daniel did not identify a labral or biceps pathology at the time of the arthrogram.  However, this is performed in the neutral position.  Mr. Garman was advised to proceed with diagnostic arthroscopy of the left glenohumeral joint  anticipating resection of the distal clavicle, repair of the supraspinatus and addressing the biceps labral pathology and other tendon pathology identified.  I personally reviewed his MR arthrogram and felt that the subscapularis had at least a grade 2 degenerative tear.  Preoperatively, he was reminded of his potential risks and benefits of surgery.  He understands he will need to be in a sling protecting his repair for 6 weeks and then will engage in therapy.  He understands that we cannot guarantee the complete pain relief, we cannot prevent further degeneration of his rotator cuff.  He is at slight risk for development of infection or perianesthetic complication.  Questions were invited and answered in detail.  Due to PENICILLIN allergy, we anticipate using vancomycin as a perioperative prophylactic antibiotic.  PROCEDURE:  Abou Sterkel was interviewed in the holding area and his left arm and shoulder marked as the proper surgical site with a marking pen per protocol.  He was evaluated by Dr. Al Corpus of the anesthesia team and had detailed anesthesia informed consent.  He was advised to proceed with general anesthesia by endotracheal technique supplemented by a ropivacaine plexus block.  Dr. Al Corpus placed  the ropivacaine plexus block at interscalene level preoperatively with ultrasound guidance.  This led to excellent anesthesia of the left arm and forequarter.  Questions were invited and answered in detail in the holding area followed by transferring Mr. Davis to room 2 of the [facility].  Under Dr. Al Corpus' direct supervision, general anesthesia by endotracheal technique was induced followed by careful position in the beach-chair position with the aid of a torso and head holder designed for shoulder arthroscopy.  A warming blanket was employed as were passive compression devices to prevent deep vein thrombosis on the calves.  All bony prominences were padded and great care was taken to assure that there were no tight bands around his waist or mid section including his disposable clothing worn during arthroscopy. The entire left upper extremity and forequarter were prepped with DuraPrep and draped with impervious arthroscopy drapes.  The procedure commenced with a routine surgical time-out followed by a short 2 cm incision over the palpable distal clavicle osteophyte. Subcutaneous tissues were carefully divided, incising the raphe between the deltoid and trapezius muscles.  Subperiosteal dissection of the distal 1.5 cm of clavicle allowed placement of the baby Bennett retractors followed by use of an oscillating saw to resect  the distal centimeter of clavicle.  There was some meniscus-type material that had reformed at this site and this was removed with rongeur dissection. The clavicle was removed and bony prominences were palpated.  There was a small osteophyte present posteriorly that we will relieve arthroscopically.  After the completion of the distal clavicle resection, we then repaired the deltoid to the trapezius muscle with multiple figure-of-eight sutures of 0 Vicryl.  A watertight closure was obtained, followed by repair of the skin with subcutaneous 2-0 Vicryl  and intradermal 3-0 Prolene with Steri-Strips.  We then placed the arthroscope through a standard posterior viewing portal using an anterior switching stick technique.  Diagnostic arthroscopy immediately revealed a type 3 SLAP lesion with a grossly unstable biceps origin.  A grade 2 tear of the subscapularis that was quite degenerative.  A very degenerative long head of the biceps was delaminating and a full-thickness supraspinatus rotator cuff tear.  The infraspinatus insertion was intact.  The teres minor was intact.  There was minor chondromalacia of the humeral head and grade 2 chondromalacia of the central glenoid.  All the pathology was documented with the digital camera followed by creation of an anterior portal and debridement of the subscapularis and decortication of the lesser tuberosity footprint with a 4.2 mm suction shaver.  We subsequently debrided the deep surface of the supraspinatus with a suction shaver and placed an anterior superior lateral cannula.  We used a scorpion suture passer to place the mattress stitch of fiber tape through the upper third of the subscapularis and subsequently performed a swivel lock repair to an anatomic footprint for the subscapularis. The long head of the biceps was released with a cutting cautery from the labrum and the redundant labrum and adhesions were debrided with a suction shaver.  Hemostasis was achieved with bipolar cautery.  We then entered the subacromial space and performed a bursectomy, release of the coracoacromial ligament and leveling of the acromion anteriorly to type 1 morphology.  A small medial osteophyte was leveled with the suction shaver at the [facility] joint.  Hemostasis was achieved with the bipolar cautery.  We documented several areas of severe abrasion of the rotator cuff due to the anterior osteophyte.  We then removed the arthroscopic equipment and performed a 2.5 cm muscle-splitting incision between the anterior /  middle thirds of the deltoid.  We identified the full-thickness tear.  We opened this slightly to allow capture of the long head of the biceps.  We decorticated the greater tuberosity with the suction shaver, placed a 4 grasp Krackow-type fibertape stitch in the long head of the biceps that was then passed with a Mayo needle through the anterior supraspinatus.  We placed a medial swivel lock at the anatomic footprint adjacent to the hyaline cartilage of the humeral head, and placed a mattress suture of #2 FiberWire posteriorly. Two passes of the fiber tape anteriorly creating a mattress stitch at the anterior supraspinatus and subsequently performed a double diamondback repair with excellent profile to 2 lateral swivel locks. The scope was replaced in the glenohumeral joint and a very satisfactory footprint of the repair of both the subscapularis and the supraspinatus was documented with the digital camera.  We subsequently irrigated the joint thoroughly to remove all clot and debris, and subsequently irrigated the joint simply as a cleansing maneuver.  The scope was removed and the portals were repaired with intradermal 3-0 Prolene and Steri-Strips and the incision repaired with mattress suture of 0 Vicryl closing the deltoid  split, followed by subcutaneous 2-0 Vicryl and intradermal 3-0 Prolene with Steri-Strips.  Mr. Wilmeth tolerated the surgery and anesthesia well.  He was transferred to the recovery room in a sling with stable vital signs.  We will admit him to the recovery care center for observation of his vital signs and appropriate antibiotic therapy with vancomycin IV 1 g in the morning followed by doxycycline 100 mg p.o. b.i.d. x4 days.  He will be provided p.o. and IV Dilaudid as a postoperative analgesic as needed.     Youlanda Mighty Espen Bethel, M.D.     RVS/MEDQ  D:  01/25/2014  T:  01/26/2014  Job:  [id number]

## 2014-01-27 ENCOUNTER — Encounter (HOSPITAL_BASED_OUTPATIENT_CLINIC_OR_DEPARTMENT_OTHER): Payer: Self-pay | Admitting: Orthopedic Surgery

## 2014-09-22 ENCOUNTER — Other Ambulatory Visit: Payer: Self-pay | Admitting: Dermatology

## 2015-06-22 ENCOUNTER — Ambulatory Visit: Payer: Self-pay | Admitting: Surgery

## 2015-06-22 NOTE — H&P (Signed)
Robert Cook 06/22/2015 2:03 PM Location: [facility] Patient #: [id number] DOB: 02-26-1956 Married / Language: English / Race: White Male  History of Present Illness Rodman Key B. Hassell Done MD; 06/22/2015 2:52 PM) The patient is a 59 year old male Robert Cook) who presents with a colonic polyp. The patient was referred by a gastroenterologist (Dr. Cristina Gong). He underwent a colonoscopy and was found to have a flat large soft fleshy mass in the cecum which the biopsy proved to be an adenomatous change with high-grade dysplasia but no evidence of any invasive cancer. He was referred by Dr. Cristina Gong for partial colectomy to remove this.  I reviewed his records with him and his wife. Both of them have been my patients in the past having done an appendectomy on Robert Cook and a lap Nissen on Robert Cook. I gave them a booklet on colon resections and explained the procedure in some detail including risk of leaks and bleeding. I think I would do a laparoscopically assisted right hemicolectomy. He would like to do this after Thanksgiving. We'll go ahead and see about getting this set up. Will give him an oral bowel prep.    Other Problems Robert Cook, CMA; 06/22/2015 2:04 PM) No pertinent past medical history  Past Surgical History Robert Cook, CMA; 06/22/2015 2:04 PM) Colon Polyp Removal - Colonoscopy Knee Surgery Bilateral. Oral Surgery Shoulder Surgery Left.  Diagnostic Studies History Robert Cook, [place]; 06/22/2015 2:04 PM) Colonoscopy within last year  Allergies Robert Cook, [place]; 06/22/2015 2:04 PM) No Known Drug Allergies 06/22/2015  Medication History Robert Cook, CMA; 06/22/2015 2:05 PM) ALPRAZolam (0.5MG  Tablet, Oral) Active. Pravastatin Sodium (40MG  Tablet, Oral) Active. FLUoxetine HCl (20MG  Capsule, Oral) Active. Medications Reconciled  Social History Robert Cook, [place]; 06/22/2015 2:04 PM) No alcohol use No drug use Tobacco use Current  every day smoker.  Family History Robert Cook, [place]; 06/22/2015 2:04 PM) Alcohol Abuse Brother, Father, Mother. Breast Cancer Mother. Heart Disease Father. Melanoma Sister. Respiratory Condition Mother.     Review of Systems Robert Cook CMA; 06/22/2015 2:04 PM) General Not Present- Appetite Loss, Chills, Fatigue, Fever, Night Sweats, Weight Gain and Weight Loss. Skin Not Present- Change in Wart/Mole, Dryness, Hives, Jaundice, New Lesions, Non-Healing Wounds, Rash and Ulcer. HEENT Not Present- Earache, Hearing Loss, Hoarseness, Nose Bleed, Oral Ulcers, Ringing in the Ears, Seasonal Allergies, Sinus Pain, Sore Throat, Visual Disturbances, Wears glasses/contact lenses and Yellow Eyes. Respiratory Not Present- Bloody sputum, Chronic Cough, Difficulty Breathing, Snoring and Wheezing. Breast Not Present- Breast Mass, Breast Pain, Nipple Discharge and Skin Changes. Cardiovascular Not Present- Chest Pain, Difficulty Breathing Lying Down, Leg Cramps, Palpitations, Rapid Heart Rate, Shortness of Breath and Swelling of Extremities. Gastrointestinal Not Present- Abdominal Pain, Bloating, Bloody Stool, Change in Bowel Habits, Chronic diarrhea, Constipation, Difficulty Swallowing, Excessive gas, Gets full quickly at meals, Hemorrhoids, Indigestion, Nausea, Rectal Pain and Vomiting. Musculoskeletal Present- Joint Pain. Not Present- Back Pain, Joint Stiffness, Muscle Pain, Muscle Weakness and Swelling of Extremities. Neurological Not Present- Decreased Memory, Fainting, Headaches, Numbness, Seizures, Tingling, Tremor, Trouble walking and Weakness. Psychiatric Present- Anxiety. Not Present- Bipolar, Change in Sleep Pattern, Depression, Fearful and Frequent crying. Endocrine Not Present- Cold Intolerance, Excessive Hunger, Hair Changes, Heat Intolerance, Hot flashes and New Diabetes. Hematology Not Present- Easy Bruising, Excessive bleeding, Gland problems, HIV and Persistent Infections.  Vitals  Robert Cook CMA; 06/22/2015 2:06 PM) 06/22/2015 2:05 PM Weight: 194.2 lb Height: 68in Body Surface Area: 2.02 m Body Mass Index: 29.53 kg/m  Temp.: 79F  Pulse: 80 (  Regular)  BP: 152/86 (Sitting, Left Arm, Standard)      Physical Exam (Rita Prom B. Hassell Done MD; 06/22/2015 2:54 PM)  The physical exam findings are as follows: Note:Head normocephalic ENT unremarkable except for glasses Neck no bruits or masses Chest clear to auscultation Heart sinus rhythm without murmurs or gallops Abdomen no palpable masses. I went over the operation during the examination as well. Extremity exam for range of motion without cyanosis edema or clubbing. No history of DVT Neuro alert and oriented 3. Motor and sensory function grossly intact.    Assessment & Plan Rodman Key B. Hassell Done MD; 06/22/2015 2:55 PM)  POLYP OF ASCENDING COLON (D12.2) Impression: Cecal adenomatous polyp with high grade dysplasia. Plan lap assisted right hemicolectomy  Current Plans Pt Education - Colonic Polyps: discussed with patient and provided information.

## 2015-08-07 NOTE — Patient Instructions (Signed)
GEROLD WEIGMAN  08/07/2015   Your procedure is scheduled on: 12/16/  Report to [facility] Main  Entrance take [facility]  elevators to 3rd floor to  [place] at    1150 AM.  Call this number if you have problems the morning of surgery [phone]   Remember: ONLY 1 PERSON MAY GO WITH YOU TO SHORT STAY TO GET  READY MORNING OF [place].  Do not eat food or drink liquids :After Midnight.     Take these medicines the morning of surgery with A SIP OF WATER: prozac DO NOT TAKE ANY DIABETIC MEDICATIONS DAY OF YOUR SURGERY                               You may not have any metal on your body including hair pins and              piercings  Do not wear jewelry,  lotions, powders or perfumes, deodorant                          Men may shave face and neck.   Do not bring valuables to the hospital. [place].  Contacts, dentures or bridgework may not be worn into surgery.  Leave suitcase in the car. After surgery it may be brought to your room.      :  Special Instructions: coughing and deep breathing exercises , leg exercises               Please read over the following fact sheets you were given: _____________________________________________________________________             [facility] - Preparing for Surgery Before surgery, you can play an important role.  Because skin is not sterile, your skin needs to be as free of germs as possible.  You can reduce the number of germs on your skin by washing with CHG (chlorahexidine gluconate) soap before surgery.  CHG is an antiseptic cleaner which kills germs and bonds with the skin to continue killing germs even after washing. Please DO NOT use if you have an allergy to CHG or antibacterial soaps.  If your skin becomes reddened/irritated stop using the CHG and inform your nurse when you arrive at Short Stay. Do not shave (including legs and underarms) for at  least 48 hours prior to the first CHG shower.  You may shave your face/neck. Please follow these instructions carefully:  1.  Shower with CHG Soap the night before surgery and the  morning of Surgery.  2.  If you choose to wash your hair, wash your hair first as usual with your  normal  shampoo.  3.  After you shampoo, rinse your hair and body thoroughly to remove the  shampoo.                           4.  Use CHG as you would any other liquid soap.  You can apply chg directly  to the skin and wash                       Gently with a  scrungie or clean washcloth.  5.  Apply the CHG Soap to your body ONLY FROM THE NECK DOWN.   Do not use on face/ open                           Wound or open sores. Avoid contact with eyes, ears mouth and genitals (private parts).                       Wash face,  Genitals (private parts) with your normal soap.             6.  Wash thoroughly, paying special attention to the area where your surgery  will be performed.  7.  Thoroughly rinse your body with warm water from the neck down.  8.  DO NOT shower/wash with your normal soap after using and rinsing off  the CHG Soap.                9.  Pat yourself dry with a clean towel.            10.  Wear clean pajamas.            11.  Place clean sheets on your bed the night of your first shower and do not  sleep with pets. Day of Surgery : Do not apply any lotions/deodorants the morning of surgery.  Please wear clean clothes to the hospital/surgery center.  FAILURE TO FOLLOW THESE INSTRUCTIONS MAY RESULT IN THE CANCELLATION OF YOUR SURGERY PATIENT SIGNATURE_________________________________  NURSE SIGNATURE__________________________________  ________________________________________________________________________  WHAT IS A BLOOD TRANSFUSION? Blood Transfusion Information  A transfusion is the replacement of blood or some of its parts. Blood is made up of multiple cells which provide different functions.  Red  blood cells carry oxygen and are used for blood loss replacement.  White blood cells fight against infection.  Platelets control bleeding.  Plasma helps clot blood.  Other blood products are available for specialized needs, such as hemophilia or other clotting disorders. BEFORE THE TRANSFUSION  Who gives blood for transfusions?   Healthy volunteers who are fully evaluated to make sure their blood is safe. This is blood bank blood. Transfusion therapy is the safest it has ever been in the practice of medicine. Before blood is taken from a donor, a complete history is taken to make sure that person has no history of diseases nor engages in risky social behavior (examples are intravenous drug use or sexual activity with multiple partners). The donor's travel history is screened to minimize risk of transmitting infections, such as malaria. The donated blood is tested for signs of infectious diseases, such as HIV and hepatitis. The blood is then tested to be sure it is compatible with you in order to minimize the chance of a transfusion reaction. If you or a relative donates blood, this is often done in anticipation of surgery and is not appropriate for emergency situations. It takes many days to process the donated blood. RISKS AND COMPLICATIONS Although transfusion therapy is very safe and saves many lives, the main dangers of transfusion include:   Getting an infectious disease.  Developing a transfusion reaction. This is an allergic reaction to something in the blood you were given. Every precaution is taken to prevent this. The decision to have a blood transfusion has been considered carefully by your caregiver before blood is given. Blood is not given unless the benefits outweigh the risks. AFTER  THE TRANSFUSION  Right after receiving a blood transfusion, you will usually feel much better and more energetic. This is especially true if your red blood cells have gotten low (anemic). The  transfusion raises the level of the red blood cells which carry oxygen, and this usually causes an energy increase.  The nurse administering the transfusion will monitor you carefully for complications. HOME CARE INSTRUCTIONS  No special instructions are needed after a transfusion. You may find your energy is better. Speak with your caregiver about any limitations on activity for underlying diseases you may have. SEEK MEDICAL CARE IF:   Your condition is not improving after your transfusion.  You develop redness or irritation at the intravenous (IV) site. SEEK IMMEDIATE MEDICAL CARE IF:  Any of the following symptoms occur over the next 12 hours:  Shaking chills.  You have a temperature by mouth above 102 F (38.9 C), not controlled by medicine.  Chest, back, or muscle pain.  People around you feel you are not acting correctly or are confused.  Shortness of breath or difficulty breathing.  Dizziness and fainting.  You get a rash or develop hives.  You have a decrease in urine output.  Your urine turns a dark color or changes to pink, red, or brown. Any of the following symptoms occur over the next 10 days:  You have a temperature by mouth above 102 F (38.9 C), not controlled by medicine.  Shortness of breath.  Weakness after normal activity.  The white part of the eye turns yellow (jaundice).  You have a decrease in the amount of urine or are urinating less often.  Your urine turns a dark color or changes to pink, red, or brown. Document Released: 08/09/2000 Document Revised: 11/04/2011 Document Reviewed: 03/28/2008 [facility] Patient Information 2014 [place], [place].  _______________________________________________________________________

## 2015-08-08 ENCOUNTER — Encounter (HOSPITAL_COMMUNITY)
Admission: RE | Admit: 2015-08-08 | Discharge: 2015-08-08 | Disposition: A | Discharge status: Home / Self Care | Payer: BLUE CROSS/BLUE SHIELD | Source: Ambulatory Visit | Attending: Surgery | Admitting: Surgery

## 2015-08-08 ENCOUNTER — Encounter (HOSPITAL_COMMUNITY): Payer: Self-pay

## 2015-08-08 HISTORY — DX: Unspecified osteoarthritis, unspecified site: M19.90

## 2015-08-08 HISTORY — DX: Malignant (primary) neoplasm, unspecified: C80.1

## 2015-08-08 LAB — BASIC METABOLIC PANEL
Anion gap: 8 (ref 5–15)
BUN: 18 mg/dL (ref 6–20)
CALCIUM: 9.3 mg/dL (ref 8.9–10.3)
CO2: 28 mmol/L (ref 22–32)
CREATININE: 1.13 mg/dL (ref 0.61–1.24)
Chloride: 104 mmol/L (ref 101–111)
GFR calc Af Amer: >60 mL/min (ref >60)
Glucose, Bld: 98 mg/dL (ref 65–99)
POTASSIUM: 4.8 mmol/L (ref 3.5–5.1)
SODIUM: 140 mmol/L (ref 135–145)

## 2015-08-08 LAB — TYPE AND SCREEN
ABO/RH(D): O POS
Antibody Screen: NEGATIVE

## 2015-08-08 LAB — CBC
HEMATOCRIT: 44.1 % (ref 39.0–52.0)
HEMOGLOBIN: 15.3 g/dL (ref 13.0–17.0)
MCH: 30.8 pg (ref 26.0–34.0)
MCHC: 34.7 g/dL (ref 30.0–36.0)
MCV: 88.9 fL (ref 78.0–100.0)
PLATELETS: 284 10*3/uL (ref 150–400)
RBC: 4.96 MIL/uL (ref 4.22–5.81)
RDW: 13.2 % (ref 11.5–15.5)
WBC: 8.5 10*3/uL (ref 4.0–10.5)

## 2015-08-08 LAB — ABO/RH: ABO/RH(D): O POS

## 2015-08-08 NOTE — Progress Notes (Addendum)
Patient in for preop appointment and when questioned regarding bowel prep instructions patient stated he accidentally threw away bowel prep instructions.  Called [facility] office and spoke with Raquel Sarna regarding patient and bowel prep instructions.  She will fax to [facility] and I will give to patient.  Patient also wanted me to make her aware that Dr Hassell Done had told him he would give him a prescription for Valium to take prior to surgery.  Raquel Sarna stated she would send Dr Hassell Done a message.  Bowel Prep instructions per [facility]  given to patient prior to leaving [facility] department.

## 2015-08-09 LAB — HEMOGLOBIN A1C
HEMOGLOBIN A1C: 6 % — AB (ref 4.8–5.6)
MEAN PLASMA GLUCOSE: 126 mg/dL

## 2015-08-09 NOTE — Progress Notes (Signed)
HgA1C done 08/08/15 routed via EPIC to Dr Hassell Done.

## 2015-08-09 NOTE — Progress Notes (Signed)
Spoke with patient by phone and patient verified that he did have bowel prep instructions from [facility]  In his brown paper bag from preop visit along with his other instructions and that the bowel prep instructions had his name and date of birth on the bowel prep instructions.

## 2015-08-11 ENCOUNTER — Inpatient Hospital Stay (HOSPITAL_COMMUNITY): Payer: BLUE CROSS/BLUE SHIELD | Admitting: Anesthesiology

## 2015-08-11 ENCOUNTER — Encounter (HOSPITAL_COMMUNITY): Payer: Self-pay | Admitting: *Deleted

## 2015-08-11 ENCOUNTER — Inpatient Hospital Stay (HOSPITAL_COMMUNITY)
Admission: RE | Admit: 2015-08-11 | Discharge: 2015-08-15 | DRG: 331 | Disposition: A | Discharge status: Home / Self Care | Payer: BLUE CROSS/BLUE SHIELD | Source: Ambulatory Visit | Attending: Surgery | Admitting: Surgery

## 2015-08-11 ENCOUNTER — Encounter (HOSPITAL_COMMUNITY)
Admission: RE | Disposition: A | Discharge status: Home / Self Care | Payer: Self-pay | Source: Ambulatory Visit | Attending: Surgery

## 2015-08-11 DIAGNOSIS — F17200 Nicotine dependence, unspecified, uncomplicated: Secondary | ICD-10-CM | POA: Diagnosis present

## 2015-08-11 DIAGNOSIS — Z803 Family history of malignant neoplasm of breast: Secondary | ICD-10-CM

## 2015-08-11 DIAGNOSIS — D120 Benign neoplasm of cecum: Principal | ICD-10-CM | POA: Diagnosis present

## 2015-08-11 DIAGNOSIS — Z9049 Acquired absence of other specified parts of digestive tract: Secondary | ICD-10-CM

## 2015-08-11 DIAGNOSIS — R51 Headache: Secondary | ICD-10-CM | POA: Diagnosis not present

## 2015-08-11 DIAGNOSIS — Z01812 Encounter for preprocedural laboratory examination: Secondary | ICD-10-CM

## 2015-08-11 DIAGNOSIS — Z8249 Family history of ischemic heart disease and other diseases of the circulatory system: Secondary | ICD-10-CM

## 2015-08-11 DIAGNOSIS — K660 Peritoneal adhesions (postprocedural) (postinfection): Secondary | ICD-10-CM | POA: Diagnosis present

## 2015-08-11 DIAGNOSIS — Z808 Family history of malignant neoplasm of other organs or systems: Secondary | ICD-10-CM | POA: Diagnosis not present

## 2015-08-11 HISTORY — PX: LAPAROSCOPIC PARTIAL COLECTOMY: SHX5907

## 2015-08-11 LAB — CREATININE, SERUM
CREATININE: 1.11 mg/dL (ref 0.61–1.24)
GFR calc Af Amer: >60 mL/min (ref >60)
GFR calc non Af Amer: >60 mL/min (ref >60)

## 2015-08-11 LAB — CBC
HCT: 40.4 % (ref 39.0–52.0)
HEMOGLOBIN: 13.6 g/dL (ref 13.0–17.0)
MCH: 30.6 pg (ref 26.0–34.0)
MCHC: 33.7 g/dL (ref 30.0–36.0)
MCV: 91 fL (ref 78.0–100.0)
Platelets: 278 10*3/uL (ref 150–400)
RBC: 4.44 MIL/uL (ref 4.22–5.81)
RDW: 13.3 % (ref 11.5–15.5)
WBC: 11.9 10*3/uL — ABNORMAL HIGH (ref 4.0–10.5)

## 2015-08-11 SURGERY — LAPAROSCOPIC PARTIAL COLECTOMY
Anesthesia: General | Site: Abdomen

## 2015-08-11 MED ORDER — NEOMYCIN SULFATE 500 MG PO TABS: 1000.0000 mg | ORAL_TABLET | ORAL | Status: DC

## 2015-08-11 MED ORDER — HYDROMORPHONE 1 MG/ML IV SOLN
INTRAVENOUS | Status: DC
  Administered 2015-08-11: 0.7 mg via INTRAVENOUS
  Administered 2015-08-12: 4.8 mg via INTRAVENOUS
  Administered 2015-08-12: 2.5 mg via INTRAVENOUS
  Administered 2015-08-12: 5.6 mg via INTRAVENOUS
  Administered 2015-08-12: 6.4 mg via INTRAVENOUS
  Administered 2015-08-13 (×2): 1.8 mg via INTRAVENOUS
  Administered 2015-08-13: 3.6 mg via INTRAVENOUS

## 2015-08-11 MED ORDER — MIDAZOLAM HCL 5 MG/5ML IJ SOLN
INTRAMUSCULAR | Status: DC | PRN
  Administered 2015-08-11: 2 mg via INTRAVENOUS

## 2015-08-11 MED ORDER — HYDROMORPHONE HCL 1 MG/ML IJ SOLN
0.5000 mg | INTRAMUSCULAR | Status: DC | PRN
  Administered 2015-08-11 (×2): 0.5 mg via INTRAVENOUS
  Filled 2015-08-11 (×2): qty 1

## 2015-08-11 MED ORDER — SODIUM CHLORIDE 0.9 % IJ SOLN: 9.0000 mL | INTRAMUSCULAR | Status: DC | PRN

## 2015-08-11 MED ORDER — MEPERIDINE HCL 50 MG/ML IJ SOLN: 6.2500 mg | INTRAMUSCULAR | Status: DC | PRN

## 2015-08-11 MED ORDER — CHLORHEXIDINE GLUCONATE CLOTH 2 % EX PADS: 6.0000 | MEDICATED_PAD | Freq: Once | CUTANEOUS | Status: DC

## 2015-08-11 MED ORDER — HYDROMORPHONE HCL 2 MG/ML IJ SOLN
INTRAMUSCULAR | Status: AC
  Filled 2015-08-11: qty 1

## 2015-08-11 MED ORDER — ONDANSETRON HCL 4 MG/2ML IJ SOLN
INTRAMUSCULAR | Status: DC | PRN
  Administered 2015-08-11: 4 mg via INTRAVENOUS

## 2015-08-11 MED ORDER — BUPIVACAINE LIPOSOME 1.3 % IJ SUSP
20.0000 mL | Freq: Once | INTRAMUSCULAR | Status: AC
  Administered 2015-08-11: 20 mL
  Filled 2015-08-11: qty 20

## 2015-08-11 MED ORDER — HEPARIN SODIUM (PORCINE) 5000 UNIT/ML IJ SOLN
5000.0000 [IU] | Freq: Once | INTRAMUSCULAR | Status: AC
  Administered 2015-08-11: 5000 [IU] via SUBCUTANEOUS
  Filled 2015-08-11: qty 1

## 2015-08-11 MED ORDER — SODIUM CHLORIDE 0.9 % IJ SOLN
INTRAMUSCULAR | Status: AC
  Filled 2015-08-11: qty 10

## 2015-08-11 MED ORDER — HYDROMORPHONE HCL 1 MG/ML IJ SOLN
INTRAMUSCULAR | Status: AC
  Filled 2015-08-11: qty 1

## 2015-08-11 MED ORDER — LACTATED RINGERS IV SOLN
INTRAVENOUS | Status: DC | PRN
  Administered 2015-08-11 (×2): via INTRAVENOUS

## 2015-08-11 MED ORDER — HEPARIN SODIUM (PORCINE) 5000 UNIT/ML IJ SOLN
5000.0000 [IU] | Freq: Three times a day (TID) | INTRAMUSCULAR | Status: DC
  Administered 2015-08-11 – 2015-08-15 (×12): 5000 [IU] via SUBCUTANEOUS
  Filled 2015-08-11 (×14): qty 1

## 2015-08-11 MED ORDER — SODIUM CHLORIDE 0.9 % IJ SOLN
INTRAMUSCULAR | Status: DC | PRN
  Administered 2015-08-11: 10 mL

## 2015-08-11 MED ORDER — SUFENTANIL CITRATE 50 MCG/ML IV SOLN
INTRAVENOUS | Status: AC
  Filled 2015-08-11: qty 1

## 2015-08-11 MED ORDER — ACETAMINOPHEN 10 MG/ML IV SOLN
INTRAVENOUS | Status: AC
  Filled 2015-08-11: qty 100

## 2015-08-11 MED ORDER — DIPHENHYDRAMINE HCL 50 MG/ML IJ SOLN: 12.5000 mg | Freq: Four times a day (QID) | INTRAMUSCULAR | Status: DC | PRN

## 2015-08-11 MED ORDER — LIDOCAINE HCL (CARDIAC) 20 MG/ML IV SOLN
INTRAVENOUS | Status: DC | PRN
  Administered 2015-08-11: 100 mg via INTRAVENOUS

## 2015-08-11 MED ORDER — DIPHENHYDRAMINE HCL 12.5 MG/5ML PO ELIX: 12.5000 mg | ORAL_SOLUTION | Freq: Four times a day (QID) | ORAL | Status: DC | PRN

## 2015-08-11 MED ORDER — ONDANSETRON HCL 4 MG/2ML IJ SOLN: 4.0000 mg | Freq: Four times a day (QID) | INTRAMUSCULAR | Status: DC | PRN

## 2015-08-11 MED ORDER — ROCURONIUM BROMIDE 100 MG/10ML IV SOLN
INTRAVENOUS | Status: AC
  Filled 2015-08-11: qty 1

## 2015-08-11 MED ORDER — KETOROLAC TROMETHAMINE 30 MG/ML IJ SOLN
30.0000 mg | Freq: Four times a day (QID) | INTRAMUSCULAR | Status: DC
  Administered 2015-08-11: 30 mg via INTRAVENOUS
  Filled 2015-08-11 (×2): qty 1

## 2015-08-11 MED ORDER — HYDROMORPHONE HCL 1 MG/ML IJ SOLN
0.2500 mg | INTRAMUSCULAR | Status: DC | PRN
  Administered 2015-08-11 (×4): 0.5 mg via INTRAVENOUS

## 2015-08-11 MED ORDER — HYDROMORPHONE HCL 1 MG/ML IJ SOLN
INTRAMUSCULAR | Status: DC | PRN
  Administered 2015-08-11 (×3): 0.5 mg via INTRAVENOUS

## 2015-08-11 MED ORDER — FENTANYL CITRATE (PF) 100 MCG/2ML IJ SOLN: 25.0000 ug | INTRAMUSCULAR | Status: DC | PRN

## 2015-08-11 MED ORDER — ONDANSETRON HCL 4 MG/2ML IJ SOLN
INTRAMUSCULAR | Status: AC
  Filled 2015-08-11: qty 2

## 2015-08-11 MED ORDER — LACTATED RINGERS IR SOLN
Status: DC | PRN
  Administered 2015-08-11: 1
  Administered 2015-08-11: 1000 mL

## 2015-08-11 MED ORDER — CEFOTETAN DISODIUM 2 G IJ SOLR
2.0000 g | Freq: Two times a day (BID) | INTRAMUSCULAR | Status: AC
  Administered 2015-08-12: 2 g via INTRAVENOUS
  Filled 2015-08-11: qty 2

## 2015-08-11 MED ORDER — PROMETHAZINE HCL 25 MG/ML IJ SOLN: 6.2500 mg | INTRAMUSCULAR | Status: DC | PRN

## 2015-08-11 MED ORDER — PROPOFOL 10 MG/ML IV BOLUS
INTRAVENOUS | Status: AC
  Filled 2015-08-11: qty 20

## 2015-08-11 MED ORDER — POTASSIUM CHLORIDE IN NACL 20-0.45 MEQ/L-% IV SOLN
INTRAVENOUS | Status: DC
  Administered 2015-08-11 – 2015-08-13 (×4): via INTRAVENOUS
  Administered 2015-08-14: 125 mL/h via INTRAVENOUS
  Administered 2015-08-14 (×2): via INTRAVENOUS
  Filled 2015-08-11 (×13): qty 1000

## 2015-08-11 MED ORDER — ROCURONIUM BROMIDE 100 MG/10ML IV SOLN
INTRAVENOUS | Status: DC | PRN
  Administered 2015-08-11: 10 mg via INTRAVENOUS
  Administered 2015-08-11: 20 mg via INTRAVENOUS
  Administered 2015-08-11: 40 mg via INTRAVENOUS
  Administered 2015-08-11: 5 mg via INTRAVENOUS

## 2015-08-11 MED ORDER — SUGAMMADEX SODIUM 200 MG/2ML IV SOLN
INTRAVENOUS | Status: AC
  Filled 2015-08-11: qty 2

## 2015-08-11 MED ORDER — DEXTROSE 5 % IV SOLN
2.0000 g | INTRAVENOUS | Status: AC
  Administered 2015-08-11: 2 g via INTRAVENOUS
  Filled 2015-08-11: qty 2

## 2015-08-11 MED ORDER — ONDANSETRON HCL 4 MG PO TABS: 4.0000 mg | ORAL_TABLET | Freq: Four times a day (QID) | ORAL | Status: DC | PRN

## 2015-08-11 MED ORDER — NALOXONE HCL 0.4 MG/ML IJ SOLN
0.4000 mg | INTRAMUSCULAR | Status: DC | PRN
Start: 2015-08-11 — End: 2015-08-13
  Administered 2015-08-12: 0.4 mg via INTRAVENOUS
  Filled 2015-08-11: qty 1

## 2015-08-11 MED ORDER — PEG 3350-KCL-NA BICARB-NACL 420 G PO SOLR: 4000.0000 mL | Freq: Once | ORAL | Status: DC

## 2015-08-11 MED ORDER — PROPOFOL 10 MG/ML IV BOLUS
INTRAVENOUS | Status: DC | PRN
  Administered 2015-08-11: 200 mg via INTRAVENOUS

## 2015-08-11 MED ORDER — 0.9 % SODIUM CHLORIDE (POUR BTL) OPTIME
TOPICAL | Status: DC | PRN
  Administered 2015-08-11: 3000 mL

## 2015-08-11 MED ORDER — SUGAMMADEX SODIUM 200 MG/2ML IV SOLN
INTRAVENOUS | Status: DC | PRN
Start: 2015-08-11 — End: 2015-08-11
  Administered 2015-08-11: 200 mg via INTRAVENOUS

## 2015-08-11 MED ORDER — SUFENTANIL CITRATE 50 MCG/ML IV SOLN
INTRAVENOUS | Status: DC | PRN
  Administered 2015-08-11 (×2): 10 ug via INTRAVENOUS
  Administered 2015-08-11: 5 ug via INTRAVENOUS
  Administered 2015-08-11 (×2): 10 ug via INTRAVENOUS
  Administered 2015-08-11: 5 ug via INTRAVENOUS

## 2015-08-11 MED ORDER — LIDOCAINE HCL (CARDIAC) 20 MG/ML IV SOLN
INTRAVENOUS | Status: AC
  Filled 2015-08-11: qty 5

## 2015-08-11 MED ORDER — ERYTHROMYCIN BASE 250 MG PO TABS: 1000.0000 mg | ORAL_TABLET | ORAL | Status: DC

## 2015-08-11 MED ORDER — CEFOTETAN DISODIUM-DEXTROSE 2-2.08 GM-% IV SOLR
INTRAVENOUS | Status: AC
  Filled 2015-08-11: qty 50

## 2015-08-11 MED ORDER — MIDAZOLAM HCL 2 MG/2ML IJ SOLN
INTRAMUSCULAR | Status: AC
  Filled 2015-08-11: qty 2

## 2015-08-11 MED ORDER — HYDROMORPHONE 1 MG/ML IV SOLN
INTRAVENOUS | Status: DC
  Administered 2015-08-11: 21:00:00 via INTRAVENOUS
  Filled 2015-08-11 (×2): qty 25

## 2015-08-11 MED ORDER — ACETAMINOPHEN 10 MG/ML IV SOLN
1000.0000 mg | Freq: Four times a day (QID) | INTRAVENOUS | Status: DC
  Administered 2015-08-11: 1000 mg via INTRAVENOUS

## 2015-08-11 SURGICAL SUPPLY — 60 items
APPLIER CLIP 5 13 M/L LIGAMAX5 (MISCELLANEOUS)
APPLIER CLIP ROT 10 11.4 M/L (STAPLE)
BLADE EXTENDED COATED 6.5IN (ELECTRODE) IMPLANT
CABLE HIGH FREQUENCY MONO STRZ (ELECTRODE) ×2 IMPLANT
CELLS DAT CNTRL 66122 CELL SVR (MISCELLANEOUS) ×1 IMPLANT
CHLORAPREP W/TINT 26ML (MISCELLANEOUS) ×2 IMPLANT
CLIP APPLIE 5 13 M/L LIGAMAX5 (MISCELLANEOUS) IMPLANT
CLIP APPLIE ROT 10 11.4 M/L (STAPLE) IMPLANT
COUNTER NEEDLE 20 DBL MAG RED (NEEDLE) ×2 IMPLANT
COVER SURGICAL LIGHT HANDLE (MISCELLANEOUS) IMPLANT
DECANTER SPIKE VIAL GLASS SM (MISCELLANEOUS) IMPLANT
DRAPE LAPAROSCOPIC ABDOMINAL (DRAPES) ×2 IMPLANT
DRSG OPSITE POSTOP 4X10 (GAUZE/BANDAGES/DRESSINGS) IMPLANT
DRSG OPSITE POSTOP 4X6 (GAUZE/BANDAGES/DRESSINGS) IMPLANT
DRSG OPSITE POSTOP 4X8 (GAUZE/BANDAGES/DRESSINGS) ×4 IMPLANT
ELECT PENCIL ROCKER SW 15FT (MISCELLANEOUS) ×4 IMPLANT
ELECT REM PT RETURN 9FT ADLT (ELECTROSURGICAL) ×2
ELECTRODE REM PT RTRN 9FT ADLT (ELECTROSURGICAL) ×1 IMPLANT
GAUZE SPONGE 2X2 8PLY STRL LF (GAUZE/BANDAGES/DRESSINGS) ×2 IMPLANT
GAUZE SPONGE 4X4 12PLY STRL (GAUZE/BANDAGES/DRESSINGS) IMPLANT
GLOVE BIO SURGEON STRL SZ 6.5 (GLOVE) ×4 IMPLANT
GLOVE BIOGEL M 8.0 STRL (GLOVE) ×4 IMPLANT
GLOVE BIOGEL PI IND STRL 7.0 (GLOVE) ×4 IMPLANT
GLOVE BIOGEL PI INDICATOR 7.0 (GLOVE) ×4
GOWN STRL REUS W/TWL XL LVL3 (GOWN DISPOSABLE) ×16 IMPLANT
HANDLE STAPLE EGIA 4 XL (STAPLE) ×2 IMPLANT
LEGGING LITHOTOMY PAIR STRL (DRAPES) IMPLANT
LIGASURE IMPACT 36 18CM CVD LR (INSTRUMENTS) IMPLANT
NS IRRIG 1000ML POUR BTL (IV SOLUTION) ×6 IMPLANT
PACK COLON (CUSTOM PROCEDURE TRAY) ×2 IMPLANT
PEN SKIN MARKING BROAD (MISCELLANEOUS) ×2 IMPLANT
RELOAD EGIA 60 MED/THCK PURPLE (STAPLE) ×6 IMPLANT
RTRCTR WOUND ALEXIS 18CM MED (MISCELLANEOUS) ×2
SCISSORS LAP 5X45 EPIX DISP (ENDOMECHANICALS) ×2 IMPLANT
SET IRRIG TUBING LAPAROSCOPIC (IRRIGATION / IRRIGATOR) ×2 IMPLANT
SHEARS HARMONIC ACE PLUS 45CM (MISCELLANEOUS) IMPLANT
SLEEVE XCEL OPT CAN 5 100 (ENDOMECHANICALS) ×4 IMPLANT
SPONGE GAUZE 2X2 STER 10/PKG (GAUZE/BANDAGES/DRESSINGS) ×2
SPONGE LAP 18X18 X RAY DECT (DISPOSABLE) IMPLANT
STAPLER VISISTAT 35W (STAPLE) ×2 IMPLANT
SUT NOVA 1 T20/GS 25DT (SUTURE) ×6 IMPLANT
SUT PDS AB 1 CTX 36 (SUTURE) IMPLANT
SUT PDS AB 1 TP1 96 (SUTURE) IMPLANT
SUT PDS AB 4-0 PS2 18 (SUTURE) IMPLANT
SUT PDS AB 4-0 SH 27 (SUTURE) ×4 IMPLANT
SUT PROLENE 2 0 KS (SUTURE) IMPLANT
SUT SILK 2 0 (SUTURE) ×1
SUT SILK 2 0 SH CR/8 (SUTURE) ×2 IMPLANT
SUT SILK 2-0 18XBRD TIE 12 (SUTURE) ×1 IMPLANT
SUT SILK 3 0 (SUTURE) ×1
SUT SILK 3 0 SH CR/8 (SUTURE) ×4 IMPLANT
SUT SILK 3-0 18XBRD TIE 12 (SUTURE) ×1 IMPLANT
SYS LAPSCP GELPORT 120MM (MISCELLANEOUS)
SYSTEM LAPSCP GELPORT 120MM (MISCELLANEOUS) IMPLANT
TAPE CLOTH SURG 4X10 WHT LF (GAUZE/BANDAGES/DRESSINGS) ×2 IMPLANT
TRAY FOLEY W/METER SILVER 14FR (SET/KITS/TRAYS/PACK) IMPLANT
TRAY FOLEY W/METER SILVER 16FR (SET/KITS/TRAYS/PACK) ×2 IMPLANT
TROCAR XCEL NON-BLD 11X100MML (ENDOMECHANICALS) IMPLANT
TROCAR XCEL NON-BLD 5MMX100MML (ENDOMECHANICALS) ×2 IMPLANT
TUBING FILTER THERMOFLATOR (ELECTROSURGICAL) IMPLANT

## 2015-08-11 NOTE — Anesthesia Procedure Notes (Signed)
Procedure Name: Intubation Date/Time: 08/11/2015 1:29 PM Performed by: Chyrel Masson Pre-anesthesia Checklist: Patient identified, Emergency Drugs available, Suction available and Patient being monitored Patient Re-evaluated:Patient Re-evaluated prior to inductionOxygen Delivery Method: Circle system utilized Preoxygenation: Pre-oxygenation with 100% oxygen Intubation Type: Combination inhalational/ intravenous induction and IV induction Ventilation: Mask ventilation without difficulty Laryngoscope Size: Mac and 3 Grade View: Grade II Tube type: Oral Tube size: 8.0 mm Number of attempts: 1 Airway Equipment and Method: Stylet Placement Confirmation: ETT inserted through vocal cords under direct vision,  positive ETCO2 and breath sounds checked- equal and bilateral Secured at: 23 cm Tube secured with: Tape Dental Injury: Teeth and Oropharynx as per pre-operative assessment

## 2015-08-11 NOTE — Anesthesia Preprocedure Evaluation (Addendum)
Anesthesia Evaluation  Patient identified by MRN, date of birth, ID band Patient awake    Reviewed: Allergy & Precautions, NPO status , Patient's Chart, lab work & pertinent test results  Airway Mallampati: II   Neck ROM: full    Dental   Pulmonary Current Smoker,    breath sounds clear to auscultation       Cardiovascular negative cardio ROS   Rhythm:regular Rate:Normal  ECHO 2009 EF 60% no motion abnormalities    Neuro/Psych Depression negative neurological ROS     GI/Hepatic Neg liver ROS, Colon polyp, large    Endo/Other  negative endocrine ROS  Renal/GU negative Renal ROS  negative genitourinary   Musculoskeletal  (+) Arthritis ,   Abdominal   Peds negative pediatric ROS (+)  Hematology negative hematology ROS (+)   Anesthesia Other Findings   Reproductive/Obstetrics negative OB ROS                            Anesthesia Physical Anesthesia Plan  ASA: II  Anesthesia Plan: General   Post-op Pain Management:    Induction: Intravenous  Airway Management Planned: Oral ETT  Additional Equipment:   Intra-op Plan:   Post-operative Plan: Extubation in OR  Informed Consent: I have reviewed the patients History and Physical, chart, labs and discussed the procedure including the risks, benefits and alternatives for the proposed anesthesia with the patient or authorized representative who has indicated his/her understanding and acceptance.     Plan Discussed with:   Anesthesia Plan Comments:         Anesthesia Quick Evaluation

## 2015-08-11 NOTE — H&P (Signed)
Robert Cook Patient #: [id number] DOB: 1956/05/11  CC unresectable cecal polyp  History of Present Illness  The patient is a 59 year old man who presents with a colonic polyp. The patient was referred by a gastroenterologist (Dr. Cristina Gong). He underwent a colonoscopy and was found to have a flat large soft fleshy mass in the cecum which the biopsy proved to be an adenomatous change with high-grade dysplasia but no evidence of any invasive cancer. He was referred by Dr. Cristina Gong for partial colectomy to remove this.  I reviewed his records with him and his wife. Both of them have been my patients in the past having done an appendectomy on Sibyl Parr and a lap Nissen on Mrs. Barnet Glasgow. I gave them a booklet on colon resections and explained the procedure in some detail including risk of leaks and bleeding. I think I would do a laparoscopically assisted right hemicolectomy. He would like to do this after Thanksgiving. We'll go ahead and see about getting this set up. Will give him an oral bowel prep.     No pertinent past medical history  Past Surgical History  Colon Polyp Removal - Colonoscopy Knee Surgery Bilateral. Oral Surgery Shoulder Surgery Left.  Diagnostic Studies History Colonoscopy within last year  Allergies  No Known Drug Allergies10/27/2016  Medication History  ALPRAZolam (0.5MG  Tablet, Oral) Active. Pravastatin Sodium (40MG  Tablet, Oral) Active. FLUoxetine HCl (20MG  Capsule, Oral) Active. Medications Reconciled  Social History  No alcohol use No drug use Tobacco use Current every day smoker.  Family History  Alcohol Abuse Brother, Father, Mother. Breast Cancer Mother. Heart Disease Father. Melanoma Sister. Respiratory Condition Mother.    Review of Systems  General Not Present- Appetite Loss, Chills, Fatigue, Fever, Night Sweats, Weight Gain and Weight Loss. Skin Not Present- Change in Wart/Mole, Dryness, Hives, Jaundice,  New Lesions, Non-Healing Wounds, Rash and Ulcer. HEENT Not Present- Earache, Hearing Loss, Hoarseness, Nose Bleed, Oral Ulcers, Ringing in the Ears, Seasonal Allergies, Sinus Pain, Sore Throat, Visual Disturbances, Wears glasses/contact lenses and Yellow Eyes. Respiratory Not Present- Bloody sputum, Chronic Cough, Difficulty Breathing, Snoring and Wheezing. Breast Not Present- Breast Mass, Breast Pain, Nipple Discharge and Skin Changes. Cardiovascular Not Present- Chest Pain, Difficulty Breathing Lying Down, Leg Cramps, Palpitations, Rapid Heart Rate, Shortness of Breath and Swelling of Extremities. Gastrointestinal Not Present- Abdominal Pain, Bloating, Bloody Stool, Change in Bowel Habits, Chronic diarrhea, Constipation, Difficulty Swallowing, Excessive gas, Gets full quickly at meals, Hemorrhoids, Indigestion, Nausea, Rectal Pain and Vomiting. Musculoskeletal Present- Joint Pain. Not Present- Back Pain, Joint Stiffness, Muscle Pain, Muscle Weakness and Swelling of Extremities. Neurological Not Present- Decreased Memory, Fainting, Headaches, Numbness, Seizures, Tingling, Tremor, Trouble walking and Weakness. Psychiatric Present- Anxiety. Not Present- Bipolar, Change in Sleep Pattern, Depression, Fearful and Frequent crying. Endocrine Not Present- Cold Intolerance, Excessive Hunger, Hair Changes, Heat Intolerance, Hot flashes and New Diabetes. Hematology Not Present- Easy Bruising, Excessive bleeding, Gland problems, HIV and Persistent Infections.  Weight: 194.2 lb Height: 68in Body Surface Area: 2.02 m Body Mass Index: 29.53 kg/m    Physical Exam  The physical exam findings are as follows: Note:Head normocephalic ENT unremarkable except for glasses Neck no bruits or masses Chest clear to auscultation Heart sinus rhythm without murmurs or gallops Abdomen no palpable masses. I went over the operation during the examination as well. Extremity exam for range of motion without cyanosis  edema or clubbing. No history of DVT Neuro alert and oriented 3. Motor and sensory function grossly intact.  Assessment & Plan  POLYP OF ASCENDING COLON (D12.2) Impression: Cecal adenomatous polyp with high grade dysplasia. Plan lap assisted right hemicolectomy  Matt B. Hassell Done, MD, [facility], [place]

## 2015-08-11 NOTE — Op Note (Signed)
Surgeon: Kaylyn Lim, MD, FACS  Asst:  Leighton Ruff M.D. FACS  Anes:  Gen. endotracheal  Procedure: Laparoscopically assisted right hemicolectomy  Diagnosis: Sessile polyp of the cecum that is unable to be resected with colonoscopy  Complications: None noted  EBL:   30 cc  Drains: None  Description of Procedure:  The patient was taken to OR 1 at [facility].  After anesthesia was administered and the patient was prepped a timeout was performed.  Access to the abdomen was achieved to the left upper quadrant with a 5 mm Optiview without difficulty. Following insufflation 25 mm replace above and below the umbilicus about 1 fingerbreadth apart and subsequently was placed over the right side for Dr. Marcello Moores. The patient had a previous ruptured open appendectomy with peritonitis and the cecum and terminal ileum were extremely walled off and adherent. Mobilization was performed and this involved sitting down a lot of adhesions to the anterior abdominal wall to reveal the cecum. I then identified the white line and took down the ascending colon and hepatic flexure mobilizing it down over to the duodenum. We went down and mobilize the terminal ileum. That point we opened the midline placing a wound protector in. The terminal ileum was still stuck in the pelvis dispense more time mobilizing that eventually got all that up. I could feel the mass in the cecum. I divided the terminal ileum with a Covidien laparoscopic tri-stapler. We went to the mesentery of the Harmonic Scalpel and with Kelly clamps on the ileocolic branches with oversewing with 2-0 silk. Transected just beyond the hepatic flexure with again with the purple load of the tri-stapler. The specimen was removed and we again we palpated this area and sent this for permanent sections   A side-to-side anastomosis was created laying the antimesenteric border of the terminal ileum along the tenia of the transverse colon. A holding stitch was placed and  then out of me's were placed on both small bowel and colon into which was introduced the purple load tri-stapler which was then fired creating a common channel. The common defect was closed Hoover Brunette and with canal fashion 4-0 PDS in an outside layer of 3-0 silk Lembert sutures. We then restored everything to the abdomen. We did the bowel protocol for changing contaminated equipment and basically discarded that and then regowned and gloved. We had removed previously removed the wound protector. We irrigated with saline and removed the irrigant. Down we then closed the midline incision which is approximately 9 cm with interrupted #1 Novafils. An reinsufflated and looked to closure from within. There appeared to be in order. I infiltrated the midline with 20 mL of Exparel. Wounds were closed with the staples.  The patient tolerated the procedure well and was taken to the PACU in stable condition.     Matt B. Hassell Done, [place], [facility], [place]

## 2015-08-11 NOTE — Anesthesia Postprocedure Evaluation (Signed)
Anesthesia Post Note  Patient: Robert Cook  Procedure(s) Performed: Procedure(s) (LRB): LAPAROSCOPIC PARTIAL COLECTOMY (N/A)  Patient location during evaluation: PACU Anesthesia Type: General Level of consciousness: awake and alert and patient cooperative Pain management: pain level controlled Vital Signs Assessment: post-procedure vital signs reviewed and stable Respiratory status: spontaneous breathing and respiratory function stable Cardiovascular status: stable Anesthetic complications: no    Last Vitals:  Filed Vitals:   08/11/15 1822 08/11/15 1918  BP: 155/90 145/83  Pulse: 78 83  Temp: 36.4 C 37.3 C  Resp: 14 14    Last Pain:  Filed Vitals:   08/11/15 1934  PainSc: [place]

## 2015-08-11 NOTE — Transfer of Care (Signed)
Immediate Anesthesia Transfer of Care Note  Patient: Robert Cook  Procedure(s) Performed: Procedure(s): LAPAROSCOPIC PARTIAL COLECTOMY (N/A)  Patient Location: PACU  Anesthesia Type:General  Level of Consciousness: awake, alert , oriented and patient cooperative  Airway & Oxygen Therapy: Patient Spontanous Breathing and Patient connected to face mask oxygen  Post-op Assessment: Report given to RN, Post -op Vital signs reviewed and stable and Patient moving all extremities  Post vital signs: Reviewed and stable  Last Vitals:  Filed Vitals:   08/11/15 1208 08/11/15 1211  BP: 140/87   Pulse: 97   Temp:  36.7 C  Resp: 18     Complications: No apparent anesthesia complications

## 2015-08-12 LAB — CBC
HEMATOCRIT: 35.9 % — AB (ref 39.0–52.0)
HEMOGLOBIN: 12 g/dL — AB (ref 13.0–17.0)
MCH: 31 pg (ref 26.0–34.0)
MCHC: 33.4 g/dL (ref 30.0–36.0)
MCV: 92.8 fL (ref 78.0–100.0)
Platelets: 233 10*3/uL (ref 150–400)
RBC: 3.87 MIL/uL — AB (ref 4.22–5.81)
RDW: 13.8 % (ref 11.5–15.5)
WBC: 7.8 10*3/uL (ref 4.0–10.5)

## 2015-08-12 LAB — BASIC METABOLIC PANEL
ANION GAP: 5 (ref 5–15)
BUN: 13 mg/dL (ref 6–20)
CO2: 28 mmol/L (ref 22–32)
Calcium: 7.9 mg/dL — ABNORMAL LOW (ref 8.9–10.3)
Chloride: 105 mmol/L (ref 101–111)
Creatinine, Ser: 1.1 mg/dL (ref 0.61–1.24)
GLUCOSE: 107 mg/dL — AB (ref 65–99)
POTASSIUM: 4.1 mmol/L (ref 3.5–5.1)
SODIUM: 138 mmol/L (ref 135–145)

## 2015-08-12 MED ORDER — KETOROLAC TROMETHAMINE 30 MG/ML IJ SOLN
30.0000 mg | Freq: Four times a day (QID) | INTRAMUSCULAR | Status: AC
  Administered 2015-08-12 (×3): 30 mg via INTRAVENOUS
  Filled 2015-08-12 (×5): qty 1

## 2015-08-12 NOTE — Progress Notes (Signed)
1 Day Post-Op  Subjective: No complaints  Objective: Vital signs in last 24 hours: Temp:  [97.4 F (36.3 C)-99.4 F (37.4 C)] 99.3 F (37.4 C) (12/17 0535) Pulse Rate:  [64-97] 72 (12/17 0535) Resp:  [6-18] 12 (12/17 0535) BP: (123-157)/(63-91) 123/63 mmHg (12/17 0535) SpO2:  [93 %-100 %] 99 % (12/17 0535) FiO2 (%):  [50 %] 50 % (12/17 0416) Weight:  [86.183 kg (190 lb)] 86.183 kg (190 lb) (12/16 1208) Last BM Date: 08/10/15  Intake/Output from previous day: 12/16 0701 - 12/17 0700 In: 3597.9 [I.V.:3447.9; IV Piggyback:150] Out: 725 [Urine:675; Blood:50] Intake/Output this shift:    Resp: clear to auscultation bilaterally Cardio: regular rate and rhythm GI: soft, minimal tenderness  Lab Results:   Recent Labs  08/11/15 1909 08/12/15 0504  WBC 11.9* 7.8  HGB 13.6 12.0*  HCT 40.4 35.9*  PLT 278 233   BMET  Recent Labs  08/11/15 1909 08/12/15 0504  NA  --  138  K  --  4.1  CL  --  105  CO2  --  28  GLUCOSE  --  107*  BUN  --  13  CREATININE 1.11 1.10  CALCIUM  --  7.9*   PT/INR No results for input(s): LABPROT, INR in the last 72 hours. ABG No results for input(s): PHART, HCO3 in the last 72 hours.  Invalid input(s): PCO2, PO2  Studies/Results: No results found.  Anti-infectives: Anti-infectives    Start     Dose/Rate Route Frequency Ordered Stop   08/12/15 0100  cefoTEtan (CEFOTAN) 2 g in dextrose 5 % 50 mL IVPB     2 g 100 mL/hr over 30 Minutes Intravenous Every 12 hours 08/11/15 1726 08/12/15 0036   08/11/15 1215  cefoTEtan (CEFOTAN) 2 g in dextrose 5 % 50 mL IVPB     2 g 100 mL/hr over 30 Minutes Intravenous On call to O.R. 08/11/15 1208 08/11/15 1315   08/11/15 1207  neomycin (MYCIFRADIN) tablet 1,000 mg  Status:  Discontinued     1,000 mg Oral 3 times per day 08/11/15 1208 08/11/15 1211   08/11/15 1207  erythromycin (E-MYCIN) tablet 1,000 mg  Status:  Discontinued     1,000 mg Oral 3 times per day 08/11/15 1208 08/11/15 1212       Assessment/Plan: s/p Procedure(s): LAPAROSCOPIC PARTIAL COLECTOMY (N/A) continue ice chips today  Ambulate pca for pain control  LOS: 1 day    TOTH III,Dreshaun Stene S 08/12/2015

## 2015-08-13 MED ORDER — ACETAMINOPHEN 325 MG PO TABS
650.0000 mg | ORAL_TABLET | Freq: Four times a day (QID) | ORAL | Status: DC | PRN
  Administered 2015-08-13: 650 mg via ORAL
  Filled 2015-08-13: qty 2

## 2015-08-13 MED ORDER — ALUM & MAG HYDROXIDE-SIMETH 200-200-20 MG/5ML PO SUSP
30.0000 mL | Freq: Four times a day (QID) | ORAL | Status: DC | PRN
  Administered 2015-08-13 – 2015-08-14 (×2): 30 mL via ORAL
  Filled 2015-08-13 (×3): qty 30

## 2015-08-13 MED ORDER — HYDROCODONE-ACETAMINOPHEN 5-325 MG PO TABS
1.0000 | ORAL_TABLET | ORAL | Status: DC | PRN
  Administered 2015-08-13 – 2015-08-15 (×11): 2 via ORAL
  Filled 2015-08-13 (×12): qty 2

## 2015-08-13 NOTE — Progress Notes (Signed)
General Surgery Note  LOS: 2 days  POD -  2 Days Post-Op  Assessment/Plan: 1.  LAPAROSCOPIC PARTIAL COLECTOMY - 08/11/2015 - M. Martin  For cecal polyp  Passing flatus - has headache - does not like dilaudid  Will start clear liquids and Vicodin for pain  2.  DVT prophylaxis - SQ Heparin   Principal Problem:   S/P right colectomy Dec 2016   Subjective:  Passing flatus - has headache - does not like dilaudid.   Objective:   Filed Vitals:   08/13/15 0617 08/13/15 0827  BP: 137/70   Pulse: 77   Temp: 98.8 F (37.1 C)   Resp: 16 13     Intake/Output from previous day:  12/17 0701 - 12/18 0700 In: 2504.2 [I.V.:2504.2] Out: 50 [Urine:50]  Intake/Output this shift:      Physical Exam:   General: WN WM who is alert and oriented.    HEENT: Normal. Pupils equal. .   Lungs: Clear.  IS - 1,800 cc   Abdomen: Soft.  Has BS.   Wound: Clean.     Lab Results:    Recent Labs  08/11/15 1909 08/12/15 0504  WBC 11.9* 7.8  HGB 13.6 12.0*  HCT 40.4 35.9*  PLT 278 233    BMET   Recent Labs  08/11/15 1909 08/12/15 0504  NA  --  138  K  --  4.1  CL  --  105  CO2  --  28  GLUCOSE  --  107*  BUN  --  13  CREATININE 1.11 1.10  CALCIUM  --  7.9*    PT/INR  No results for input(s): LABPROT, INR in the last 72 hours.  ABG  No results for input(s): PHART, HCO3 in the last 72 hours.  Invalid input(s): PCO2, PO2   Studies/Results:  No results found.   Anti-infectives:   Anti-infectives    Start     Dose/Rate Route Frequency Ordered Stop   08/12/15 0100  cefoTEtan (CEFOTAN) 2 g in dextrose 5 % 50 mL IVPB     2 g 100 mL/hr over 30 Minutes Intravenous Every 12 hours 08/11/15 1726 08/12/15 0036   08/11/15 1215  cefoTEtan (CEFOTAN) 2 g in dextrose 5 % 50 mL IVPB     2 g 100 mL/hr over 30 Minutes Intravenous On call to O.R. 08/11/15 1208 08/11/15 1315   08/11/15 1207  neomycin (MYCIFRADIN) tablet 1,000 mg  Status:  Discontinued     1,000 mg Oral 3 times per day  08/11/15 1208 08/11/15 1211   08/11/15 1207  erythromycin (E-MYCIN) tablet 1,000 mg  Status:  Discontinued     1,000 mg Oral 3 times per day 08/11/15 1208 08/11/15 1212      Alphonsa Overall, MD, FACS Pager: [facility] Office: [phone] 08/13/2015

## 2015-08-14 NOTE — Care Management Note (Signed)
Case Management Note  Patient Details  Name: ICKER GRIGAS MRN: [id number] Date of Birth: [birth year]  Subjective/Objective:     S/p Laparoscopically assisted right hemicolectomy               Action/Plan: Discharge planning, no HH needs identified  Expected Discharge Date:                  Expected Discharge Plan:  Home/Self Care  In-House Referral:  NA  Discharge planning Services  CM Consult  Post Acute Care Choice:  NA Choice offered to:  NA  DME Arranged:  N/A DME Agency:  NA  HH Arranged:  NA HH Agency:  NA  Status of Service:  Completed, signed off  Medicare Important Message Given:    Date Medicare IM Given:    Medicare IM give by:    Date Additional Medicare IM Given:    Additional Medicare Important Message give by:     If discussed at [place] of Stay Meetings, dates discussed:    Additional Comments:  Guadalupe Maple, RN 08/14/2015, 9:50 AM

## 2015-08-14 NOTE — Progress Notes (Signed)
Patient ID: Robert Cook, male   DOB: 05/26/1956, 59 y.o.   MRN: [id number] [facility] Progress Note:   3 Days Post-Op  Subjective: Mental status is clear Objective: Vital signs in last 24 hours: Temp:  [97.8 F (36.6 C)-98.6 F (37 C)] 97.8 F (36.6 C) (12/19 0553) Pulse Rate:  [69-74] 69 (12/19 0553) Resp:  [16] 16 (12/19 0553) BP: (116-153)/(69-81) 146/81 mmHg (12/19 0553) SpO2:  [97 %-98 %] 98 % (12/19 0553)  Intake/Output from previous day: 12/18 0701 - 12/19 0700 In: 3275.8 [P.O.:780; I.V.:2495.8] Out: -  Intake/Output this shift:    Physical Exam: Work of breathing is not labored.  Incisions covered.  Taking clears and passing gas.    Lab Results:  No results found for this or any previous visit (from the past 48 hour(s)).  Radiology/Results: No results found.  Anti-infectives: Anti-infectives    Start     Dose/Rate Route Frequency Ordered Stop   08/12/15 0100  cefoTEtan (CEFOTAN) 2 g in dextrose 5 % 50 mL IVPB     2 g 100 mL/hr over 30 Minutes Intravenous Every 12 hours 08/11/15 1726 08/12/15 0036   08/11/15 1215  cefoTEtan (CEFOTAN) 2 g in dextrose 5 % 50 mL IVPB     2 g 100 mL/hr over 30 Minutes Intravenous On call to O.R. 08/11/15 1208 08/11/15 1315   08/11/15 1207  neomycin (MYCIFRADIN) tablet 1,000 mg  Status:  Discontinued     1,000 mg Oral 3 times per day 08/11/15 1208 08/11/15 1211   08/11/15 1207  erythromycin (E-MYCIN) tablet 1,000 mg  Status:  Discontinued     1,000 mg Oral 3 times per day 08/11/15 1208 08/11/15 1212      Assessment/Plan: Problem List: Patient Active Problem List   Diagnosis Date Noted  . S/P right colectomy Dec 2016 08/11/2015  . Complete rupture of rotator cuff 01/25/2014    Advance to full liquids.   3 Days Post-Op    LOS: 3 days   Matt B. Hassell Done, MD, [facility] [phone] beeper [phone]  08/14/2015 10:14 AM

## 2015-08-15 MED ORDER — HYDROCODONE-ACETAMINOPHEN 5-325 MG PO TABS: 1.0000 | ORAL_TABLET | ORAL | Status: DC | PRN

## 2015-08-15 NOTE — Discharge Instructions (Signed)
Laparoscopic Colectomy  Laparoscopic colectomy is surgery to remove part or all of the large intestine (colon). This procedure is used to treat several conditions, including:  · Inflammation and infection of the colon (diverticulitis).  · Tumors or masses in the colon.  · Inflammatory bowel disease, such as Crohn disease or ulcerative colitis. Colectomy is an option when symptoms cannot be controlled with medicines.  · Bleeding from the colon that cannot be controlled by another method.  · Blockage or obstruction of the colon.  LET YOUR HEALTH CARE PROVIDER KNOW ABOUT:  · Any allergies you have.  · All medicines you are taking, including vitamins, herbs, eye drops, creams, and over-the-counter medicines.  · Previous problems you or members of your family have had with the use of anesthetics.  · Any blood disorders you have.  · Previous surgeries you have had.  · Medical conditions you have.  RISKS AND COMPLICATIONS  Generally, this is a safe procedure. However, as with any procedure, complications can occur. Possible complications include:  · Infection.  · Bleeding.  · Damage to other organs.  · Leaking from where the colon was sewn together.  · Future blockage of the small intestines from scar tissue. Another surgery may be needed to repair this.  In some cases, complications such as damage to other organs or excessive bleeding may require the surgeon to convert from a laparoscopic procedure to an open procedure. This involves making a larger incision in the abdomen to perform the procedure.  BEFORE THE PROCEDURE  · Ask your health care provider about changing or stopping any regular medicines.  · You may be prescribed an oral bowel prep. This involves drinking a large amount of medicated liquid, starting the day before your surgery. The liquid will cause you to have multiple loose stools until your stool is almost clear or light green. This cleans out your colon in preparation for the surgery.  · Do not eat or  drink anything else once you have started the bowel prep, unless your health care provider tells you it is safe to do so.  · You may also be given antibiotic pills to clean out your colon of bacteria. Be sure to follow the directions carefully and take the medicine at the correct time.  PROCEDURE   · Small monitors will be put on your body. They are used to check your heart, blood pressure, and oxygen level.  · An IV access tube will be put into one of your veins. Medicine will be able to flow directly into your body through this IV tube.  · You might be given a medicine to help you relax (sedative).  · You will be given a medicine to make you sleep through the procedure (general anesthetic). A breathing tube may be placed into your lungs during the procedure.  · A thin, flexible tube (catheter) will be placed into your bladder to collect urine.  · A tube may be put in through your nose. It is called a nasogastric tube. It is used to remove stomach fluids after surgery until the intestines start working again.  · Your abdomen will be filled with air so that it expands. This gives the surgeon more room to operate and makes your organs easier to see.  · Several small cuts (incisions) are made in your abdomen.  · A thin, lighted tube with a tiny camera on the end (laparoscope) is put through one of the small incisions. The camera on the laparoscope   sends a picture to a TV screen in the operating room. This gives the surgeon a good view inside your abdomen.  · Hollow tubes are put through the other small incisions in your abdomen. The tools needed for the procedure are put through these tubes.  · Clamps or staples are put on both ends of the diseased part of the colon.  · The part of the intestine between the clamps or staples is removed.  · If possible, the ends of the healthy colon that remain will be stitched or stapled together to allow your body to expel waste (stool).  · Sometimes, the remaining colon cannot be  stitched back together. If this is the case, a colostomy is needed. For a colostomy:  ¨ An opening (stoma) to the outside of your body is made through the abdomen.  ¨ The end of the colon is brought to the opening. It is stitched to the skin.  ¨ A bag is attached to the opening. Stool will drain into this bag. The bag is removable.  ¨ The colostomy can be temporary or permanent.  · The incisions from the colectomy are closed with stitches or staples.  AFTER THE PROCEDURE  · You will be monitored closely in a recovery area until you are stable and doing well. You will then be moved to a regular hospital room.  · You will need to receive fluids through an IV tube until your bowel function has returned. This may take 1-3 days. Once your bowels are working again, you will be started on clear liquids and then advanced to solid food as tolerated.  · You will be given pain medicines to control your pain.     This information is not intended to replace advice given to you by your health care provider. Make sure you discuss any questions you have with your health care provider.     Document Released: 11/02/2002 Document Revised: 06/02/2013 Document Reviewed: 03/24/2013  Elsevier Interactive Patient Education ©2016 [employer]

## 2015-08-15 NOTE — Discharge Summary (Signed)
Physician Discharge Summary  Patient ID: Robert Cook MRN: [id number] DOB/AGE: 04-29-1956 59 y.o.  Admit date: 08/11/2015 Discharge date: 08/15/2015  Admission Diagnoses:  Unresectable cecal polyp  Discharge Diagnoses:   - TUBULAR ADENOMA (5.3 CM) WITH FOCAL HIGH GRADE DYSPLASIA. - HIGH GRADE DYSPLASIA COMPRISES LESS THAN 5% OF ADENOMATOUS TISSUE. - MARGINS ARE NEGATIVE FOR DYSPLASIA OR MALIGNANCY. - THIRTEEN BENIGN LYMPH NODES WITH NO TUMOR SEEN (0/13). Principal Problem:   S/P right colectomy Dec 2016   Surgery:  Right hemicolectomy  Discharged Condition: improved  Hospital Course:   Had surgery.  Observed until bowel function returned then started clears and advance.    Consults: none  Significant Diagnostic Studies: path    Discharge Exam: Blood pressure 163/88, pulse 67, temperature 97.7 F (36.5 C), temperature source Oral, resp. rate 16, height 5\' 8"  (1.727 m), weight 86.183 kg (190 lb), SpO2 98 %. Incisions OK without evidence of infection  Disposition: 01-Home or Self Care  Discharge Instructions    Diet general    Complete by:  As directed      Discharge instructions    Complete by:  As directed   May shower Staples out in "nurse only' visit at [facility]  [phone]     Discharge wound care:    Complete by:  As directed   You may apply neosporin to staple line until staples removed.     Increase activity slowly    Complete by:  As directed             Medication List    TAKE these medications        ALPRAZolam 0.5 MG tablet  Commonly known as:  XANAX  Take 0.5 mg by mouth at bedtime as needed for anxiety.     FLUoxetine 20 MG capsule  Commonly known as:  PROZAC  Take 20 mg by mouth daily. Patient takes at nite     HYDROcodone-acetaminophen 5-325 MG tablet  Commonly known as:  NORCO/VICODIN  Take 1-2 tablets by mouth every 4 (four) hours as needed for moderate pain.     OLANZapine 20 MG tablet  Commonly known as:  ZYPREXA  Take 20 mg by mouth  at bedtime.     pravastatin 40 MG tablet  Commonly known as:  PRAVACHOL  Take 40 mg by mouth every evening.           Follow-up Information    Follow up with Pedro Earls, MD.   Specialty:  General Surgery   Contact information:   [address] [place] [address] [phone]       Signed: Pedro Earls 08/15/2015, 2:31 PM

## 2015-08-15 NOTE — Progress Notes (Signed)
Assessment unchanged. Pt verbalized understanding of dc instructions through teach back including follow up care and when to call the doctor. Script x 1 given as provided by MD. Discharged via wc to front entrance to meet daughter and awaiting vehicle to carry home. Accompanied by NT.

## 2015-12-12 DIAGNOSIS — Z85828 Personal history of other malignant neoplasm of skin: Secondary | ICD-10-CM | POA: Diagnosis not present

## 2015-12-12 DIAGNOSIS — L821 Other seborrheic keratosis: Secondary | ICD-10-CM | POA: Diagnosis not present

## 2015-12-12 DIAGNOSIS — L812 Freckles: Secondary | ICD-10-CM | POA: Diagnosis not present

## 2015-12-12 DIAGNOSIS — D225 Melanocytic nevi of trunk: Secondary | ICD-10-CM | POA: Diagnosis not present

## 2015-12-12 DIAGNOSIS — L570 Actinic keratosis: Secondary | ICD-10-CM | POA: Diagnosis not present

## 2016-02-08 DIAGNOSIS — F3181 Bipolar II disorder: Secondary | ICD-10-CM | POA: Diagnosis not present

## 2016-04-11 DIAGNOSIS — N5201 Erectile dysfunction due to arterial insufficiency: Secondary | ICD-10-CM | POA: Diagnosis not present

## 2016-06-13 DIAGNOSIS — D1801 Hemangioma of skin and subcutaneous tissue: Secondary | ICD-10-CM | POA: Diagnosis not present

## 2016-06-13 DIAGNOSIS — C44529 Squamous cell carcinoma of skin of other part of trunk: Secondary | ICD-10-CM | POA: Diagnosis not present

## 2016-06-13 DIAGNOSIS — D225 Melanocytic nevi of trunk: Secondary | ICD-10-CM | POA: Diagnosis not present

## 2016-06-13 DIAGNOSIS — Z85828 Personal history of other malignant neoplasm of skin: Secondary | ICD-10-CM | POA: Diagnosis not present

## 2016-06-13 DIAGNOSIS — L812 Freckles: Secondary | ICD-10-CM | POA: Diagnosis not present

## 2016-06-13 DIAGNOSIS — L820 Inflamed seborrheic keratosis: Secondary | ICD-10-CM | POA: Diagnosis not present

## 2016-06-13 DIAGNOSIS — L570 Actinic keratosis: Secondary | ICD-10-CM | POA: Diagnosis not present

## 2016-07-04 DIAGNOSIS — N5201 Erectile dysfunction due to arterial insufficiency: Secondary | ICD-10-CM | POA: Diagnosis not present

## 2016-07-30 DIAGNOSIS — H5213 Myopia, bilateral: Secondary | ICD-10-CM | POA: Diagnosis not present

## 2016-07-30 DIAGNOSIS — H524 Presbyopia: Secondary | ICD-10-CM | POA: Diagnosis not present

## 2016-08-06 DIAGNOSIS — N5201 Erectile dysfunction due to arterial insufficiency: Secondary | ICD-10-CM | POA: Diagnosis not present

## 2016-09-05 DIAGNOSIS — F3181 Bipolar II disorder: Secondary | ICD-10-CM | POA: Diagnosis not present

## 2016-09-09 DIAGNOSIS — M170 Bilateral primary osteoarthritis of knee: Secondary | ICD-10-CM | POA: Diagnosis not present

## 2016-09-09 DIAGNOSIS — Z23 Encounter for immunization: Secondary | ICD-10-CM | POA: Diagnosis not present

## 2016-09-09 DIAGNOSIS — Z0000 Encounter for general adult medical examination without abnormal findings: Secondary | ICD-10-CM | POA: Diagnosis not present

## 2016-09-09 DIAGNOSIS — F17200 Nicotine dependence, unspecified, uncomplicated: Secondary | ICD-10-CM | POA: Diagnosis not present

## 2016-09-09 DIAGNOSIS — N529 Male erectile dysfunction, unspecified: Secondary | ICD-10-CM | POA: Diagnosis not present

## 2016-09-09 DIAGNOSIS — Z125 Encounter for screening for malignant neoplasm of prostate: Secondary | ICD-10-CM | POA: Diagnosis not present

## 2016-09-09 DIAGNOSIS — E7800 Pure hypercholesterolemia, unspecified: Secondary | ICD-10-CM | POA: Diagnosis not present

## 2016-10-10 DIAGNOSIS — N5201 Erectile dysfunction due to arterial insufficiency: Secondary | ICD-10-CM | POA: Diagnosis not present

## 2016-10-10 DIAGNOSIS — E291 Testicular hypofunction: Secondary | ICD-10-CM | POA: Diagnosis not present

## 2017-01-03 DIAGNOSIS — N5201 Erectile dysfunction due to arterial insufficiency: Secondary | ICD-10-CM | POA: Diagnosis not present

## 2017-02-18 DIAGNOSIS — N5201 Erectile dysfunction due to arterial insufficiency: Secondary | ICD-10-CM | POA: Diagnosis not present

## 2017-02-18 DIAGNOSIS — E291 Testicular hypofunction: Secondary | ICD-10-CM | POA: Diagnosis not present

## 2017-02-25 DIAGNOSIS — L812 Freckles: Secondary | ICD-10-CM | POA: Diagnosis not present

## 2017-02-25 DIAGNOSIS — L570 Actinic keratosis: Secondary | ICD-10-CM | POA: Diagnosis not present

## 2017-02-25 DIAGNOSIS — Z85828 Personal history of other malignant neoplasm of skin: Secondary | ICD-10-CM | POA: Diagnosis not present

## 2017-02-25 DIAGNOSIS — L821 Other seborrheic keratosis: Secondary | ICD-10-CM | POA: Diagnosis not present

## 2017-04-02 DIAGNOSIS — M7711 Lateral epicondylitis, right elbow: Secondary | ICD-10-CM | POA: Diagnosis not present

## 2017-05-06 DIAGNOSIS — F3181 Bipolar II disorder: Secondary | ICD-10-CM | POA: Diagnosis not present

## 2017-05-08 DIAGNOSIS — N5201 Erectile dysfunction due to arterial insufficiency: Secondary | ICD-10-CM | POA: Diagnosis not present

## 2017-05-14 DIAGNOSIS — M7711 Lateral epicondylitis, right elbow: Secondary | ICD-10-CM | POA: Diagnosis not present

## 2017-06-09 DIAGNOSIS — Z23 Encounter for immunization: Secondary | ICD-10-CM | POA: Diagnosis not present

## 2017-08-05 DIAGNOSIS — D3131 Benign neoplasm of right choroid: Secondary | ICD-10-CM | POA: Diagnosis not present

## 2017-08-05 DIAGNOSIS — H52201 Unspecified astigmatism, right eye: Secondary | ICD-10-CM | POA: Diagnosis not present

## 2017-08-05 DIAGNOSIS — H2513 Age-related nuclear cataract, bilateral: Secondary | ICD-10-CM | POA: Diagnosis not present

## 2017-08-05 DIAGNOSIS — H524 Presbyopia: Secondary | ICD-10-CM | POA: Diagnosis not present

## 2017-08-05 DIAGNOSIS — H5213 Myopia, bilateral: Secondary | ICD-10-CM | POA: Diagnosis not present

## 2017-09-02 DIAGNOSIS — L570 Actinic keratosis: Secondary | ICD-10-CM | POA: Diagnosis not present

## 2017-09-02 DIAGNOSIS — L821 Other seborrheic keratosis: Secondary | ICD-10-CM | POA: Diagnosis not present

## 2017-09-02 DIAGNOSIS — D2261 Melanocytic nevi of right upper limb, including shoulder: Secondary | ICD-10-CM | POA: Diagnosis not present

## 2017-09-02 DIAGNOSIS — D225 Melanocytic nevi of trunk: Secondary | ICD-10-CM | POA: Diagnosis not present

## 2017-09-02 DIAGNOSIS — Z85828 Personal history of other malignant neoplasm of skin: Secondary | ICD-10-CM | POA: Diagnosis not present

## 2017-09-10 DIAGNOSIS — F17200 Nicotine dependence, unspecified, uncomplicated: Secondary | ICD-10-CM | POA: Diagnosis not present

## 2017-09-10 DIAGNOSIS — E7800 Pure hypercholesterolemia, unspecified: Secondary | ICD-10-CM | POA: Diagnosis not present

## 2017-09-10 DIAGNOSIS — Z0000 Encounter for general adult medical examination without abnormal findings: Secondary | ICD-10-CM | POA: Diagnosis not present

## 2017-09-10 DIAGNOSIS — N529 Male erectile dysfunction, unspecified: Secondary | ICD-10-CM | POA: Diagnosis not present

## 2017-09-10 DIAGNOSIS — M170 Bilateral primary osteoarthritis of knee: Secondary | ICD-10-CM | POA: Diagnosis not present

## 2017-12-30 DIAGNOSIS — F3181 Bipolar II disorder: Secondary | ICD-10-CM | POA: Diagnosis not present

## 2018-04-21 DIAGNOSIS — D2261 Melanocytic nevi of right upper limb, including shoulder: Secondary | ICD-10-CM | POA: Diagnosis not present

## 2018-04-21 DIAGNOSIS — D225 Melanocytic nevi of trunk: Secondary | ICD-10-CM | POA: Diagnosis not present

## 2018-04-21 DIAGNOSIS — Z85828 Personal history of other malignant neoplasm of skin: Secondary | ICD-10-CM | POA: Diagnosis not present

## 2018-04-21 DIAGNOSIS — L570 Actinic keratosis: Secondary | ICD-10-CM | POA: Diagnosis not present

## 2018-07-21 DIAGNOSIS — Z23 Encounter for immunization: Secondary | ICD-10-CM | POA: Diagnosis not present

## 2018-09-01 DIAGNOSIS — F3181 Bipolar II disorder: Secondary | ICD-10-CM | POA: Diagnosis not present

## 2018-09-08 DIAGNOSIS — Z86010 Personal history of colonic polyps: Secondary | ICD-10-CM | POA: Diagnosis not present

## 2018-09-08 DIAGNOSIS — K635 Polyp of colon: Secondary | ICD-10-CM | POA: Diagnosis not present

## 2018-09-08 DIAGNOSIS — D124 Benign neoplasm of descending colon: Secondary | ICD-10-CM | POA: Diagnosis not present

## 2018-09-11 DIAGNOSIS — D124 Benign neoplasm of descending colon: Secondary | ICD-10-CM | POA: Diagnosis not present

## 2018-09-11 DIAGNOSIS — K635 Polyp of colon: Secondary | ICD-10-CM | POA: Diagnosis not present

## 2018-09-21 DIAGNOSIS — M170 Bilateral primary osteoarthritis of knee: Secondary | ICD-10-CM | POA: Diagnosis not present

## 2018-09-21 DIAGNOSIS — Z79899 Other long term (current) drug therapy: Secondary | ICD-10-CM | POA: Diagnosis not present

## 2018-09-21 DIAGNOSIS — N529 Male erectile dysfunction, unspecified: Secondary | ICD-10-CM | POA: Diagnosis not present

## 2018-09-21 DIAGNOSIS — Z125 Encounter for screening for malignant neoplasm of prostate: Secondary | ICD-10-CM | POA: Diagnosis not present

## 2018-09-21 DIAGNOSIS — F17200 Nicotine dependence, unspecified, uncomplicated: Secondary | ICD-10-CM | POA: Diagnosis not present

## 2018-09-21 DIAGNOSIS — E7800 Pure hypercholesterolemia, unspecified: Secondary | ICD-10-CM | POA: Diagnosis not present

## 2018-09-21 DIAGNOSIS — Z0000 Encounter for general adult medical examination without abnormal findings: Secondary | ICD-10-CM | POA: Diagnosis not present

## 2018-09-23 DIAGNOSIS — H2513 Age-related nuclear cataract, bilateral: Secondary | ICD-10-CM | POA: Diagnosis not present

## 2018-11-05 DIAGNOSIS — N5201 Erectile dysfunction due to arterial insufficiency: Secondary | ICD-10-CM | POA: Diagnosis not present

## 2019-03-23 DIAGNOSIS — F3181 Bipolar II disorder: Secondary | ICD-10-CM | POA: Diagnosis not present

## 2019-04-24 DIAGNOSIS — Z20828 Contact with and (suspected) exposure to other viral communicable diseases: Secondary | ICD-10-CM | POA: Diagnosis not present

## 2019-05-25 DIAGNOSIS — L820 Inflamed seborrheic keratosis: Secondary | ICD-10-CM | POA: Diagnosis not present

## 2019-05-25 DIAGNOSIS — L570 Actinic keratosis: Secondary | ICD-10-CM | POA: Diagnosis not present

## 2019-05-25 DIAGNOSIS — L720 Epidermal cyst: Secondary | ICD-10-CM | POA: Diagnosis not present

## 2019-09-12 DIAGNOSIS — Z20828 Contact with and (suspected) exposure to other viral communicable diseases: Secondary | ICD-10-CM | POA: Diagnosis not present

## 2019-09-14 DIAGNOSIS — F3181 Bipolar II disorder: Secondary | ICD-10-CM | POA: Diagnosis not present

## 2019-10-18 DIAGNOSIS — F17200 Nicotine dependence, unspecified, uncomplicated: Secondary | ICD-10-CM | POA: Diagnosis not present

## 2019-10-18 DIAGNOSIS — N529 Male erectile dysfunction, unspecified: Secondary | ICD-10-CM | POA: Diagnosis not present

## 2019-10-18 DIAGNOSIS — M170 Bilateral primary osteoarthritis of knee: Secondary | ICD-10-CM | POA: Diagnosis not present

## 2019-10-18 DIAGNOSIS — E7800 Pure hypercholesterolemia, unspecified: Secondary | ICD-10-CM | POA: Diagnosis not present

## 2019-10-25 ENCOUNTER — Ambulatory Visit: Payer: Medicare Other | Attending: Internal Medicine

## 2019-10-25 DIAGNOSIS — Z23 Encounter for immunization: Secondary | ICD-10-CM | POA: Insufficient documentation

## 2019-10-25 NOTE — Progress Notes (Signed)
   Covid-19 Vaccination Clinic  Name:  Robert Cook    MRN: [id number] DOB: 05-11-1956  10/25/2019  [name] was observed post Covid-19 immunization for 15 minutes without incidence. He was provided with Vaccine Information Sheet and instruction to access the V-Safe system.   [name] was instructed to call 911 with any severe reactions post vaccine: Marland Kitchen Difficulty breathing  . Swelling of your face and throat  . A fast heartbeat  . A bad rash all over your body  . Dizziness and weakness    Immunizations Administered    Name Date Dose VIS Date Route   Pfizer COVID-19 Vaccine 10/25/2019 12:36 PM 0.3 mL 08/06/2019 Intramuscular   Manufacturer:    Lot: [place]   [place]: [id number]

## 2019-10-27 DIAGNOSIS — E7800 Pure hypercholesterolemia, unspecified: Secondary | ICD-10-CM | POA: Diagnosis not present

## 2019-10-27 DIAGNOSIS — R7309 Other abnormal glucose: Secondary | ICD-10-CM | POA: Diagnosis not present

## 2019-11-16 ENCOUNTER — Ambulatory Visit: Payer: Medicare Other | Attending: Internal Medicine

## 2019-11-16 DIAGNOSIS — Z23 Encounter for immunization: Secondary | ICD-10-CM

## 2019-11-16 NOTE — Progress Notes (Signed)
   Covid-19 Vaccination Clinic  Name:  Robert Cook    MRN: [id number] DOB: 1955-12-12  11/16/2019  Mr. Bent was observed post Covid-19 immunization for 15 minutes without incident. He was provided with Vaccine Information Sheet and instruction to access the V-Safe system.   [name] was instructed to call 911 with any severe reactions post vaccine: Marland Kitchen Difficulty breathing  . Swelling of face and throat  . A fast heartbeat  . A bad rash all over body  . Dizziness and weakness   Immunizations Administered    Name Date Dose VIS Date Route   Pfizer COVID-19 Vaccine 11/16/2019 12:01 PM 0.3 mL 08/06/2019 Intramuscular   Manufacturer: [place]   Lot: [id number]   [place]: [id number]

## 2020-07-03 DIAGNOSIS — N528 Other male erectile dysfunction: Secondary | ICD-10-CM | POA: Diagnosis not present

## 2020-07-03 DIAGNOSIS — E291 Testicular hypofunction: Secondary | ICD-10-CM | POA: Diagnosis not present

## 2020-08-30 DIAGNOSIS — H524 Presbyopia: Secondary | ICD-10-CM | POA: Diagnosis not present

## 2020-08-30 DIAGNOSIS — H25813 Combined forms of age-related cataract, bilateral: Secondary | ICD-10-CM | POA: Diagnosis not present

## 2020-08-30 DIAGNOSIS — H5213 Myopia, bilateral: Secondary | ICD-10-CM | POA: Diagnosis not present

## 2020-10-04 DIAGNOSIS — S0101XA Laceration without foreign body of scalp, initial encounter: Secondary | ICD-10-CM | POA: Diagnosis not present

## 2020-10-31 DIAGNOSIS — E7800 Pure hypercholesterolemia, unspecified: Secondary | ICD-10-CM | POA: Diagnosis not present

## 2020-10-31 DIAGNOSIS — M170 Bilateral primary osteoarthritis of knee: Secondary | ICD-10-CM | POA: Diagnosis not present

## 2020-10-31 DIAGNOSIS — N529 Male erectile dysfunction, unspecified: Secondary | ICD-10-CM | POA: Diagnosis not present

## 2020-10-31 DIAGNOSIS — Z0001 Encounter for general adult medical examination with abnormal findings: Secondary | ICD-10-CM | POA: Diagnosis not present

## 2020-11-01 ENCOUNTER — Other Ambulatory Visit: Payer: Self-pay | Admitting: Family Medicine

## 2020-11-01 DIAGNOSIS — F17200 Nicotine dependence, unspecified, uncomplicated: Secondary | ICD-10-CM

## 2020-11-06 ENCOUNTER — Other Ambulatory Visit: Payer: Self-pay | Admitting: *Deleted

## 2020-11-06 DIAGNOSIS — F17210 Nicotine dependence, cigarettes, uncomplicated: Secondary | ICD-10-CM

## 2020-11-06 DIAGNOSIS — Z87891 Personal history of nicotine dependence: Secondary | ICD-10-CM

## 2020-11-20 ENCOUNTER — Ambulatory Visit: Payer: Medicare Other

## 2020-12-06 ENCOUNTER — Ambulatory Visit: Payer: Medicare Other

## 2020-12-13 NOTE — Progress Notes (Signed)
Shared Decision Making Visit Lung Cancer Screening Program [phone])   Eligibility:  Age 65 y.o.  Pack Years Smoking History Calculation 48 pack year smoking history (# packs/per year x # years smoked)  Recent History of coughing up blood  no  Unexplained weight loss? no ( >Than 15 pounds within the last 6 months )  Prior History Lung / other cancer no (Diagnosis within the last 5 years already requiring surveillance chest CT Scans).  Smoking Status Current Smoker  Former Smokers: Years since quit: NA  Quit Date: NA  Visit Components:  Discussion included one or more decision making aids. yes  Discussion included risk/benefits of screening. yes  Discussion included potential follow up diagnostic testing for abnormal scans. yes  Discussion included meaning and risk of over diagnosis. yes  Discussion included meaning and risk of False Positives. yes  Discussion included meaning of total radiation exposure. yes  Counseling Included:  Importance of adherence to annual lung cancer LDCT screening. yes  Impact of comorbidities on ability to participate in the program. yes  Ability and willingness to under diagnostic treatment. yes  Smoking Cessation Counseling:  Current Smokers:   Discussed importance of smoking cessation. yes  Information about tobacco cessation classes and interventions provided to patient. yes  Patient provided with "ticket" for LDCT Scan. yes  Symptomatic Patient. no  Counseling  Diagnosis Code: Tobacco Use Z72.0  Asymptomatic Patient yes  Counseling (Intermediate counseling: > three minutes counseling) [id number]  Former Smokers:   Discussed the importance of maintaining cigarette abstinence. yes  Diagnosis Code: Personal History of Nicotine Dependence. N86.767  Information about tobacco cessation classes and interventions provided to patient. Yes  Patient provided with "ticket" for LDCT Scan. yes  Written Order for Lung Cancer  Screening with LDCT placed in Epic. Yes (CT Chest Lung Cancer Screening Low Dose W/O CM) [id number] Z12.2-Screening of respiratory organs Z87.891-Personal history of nicotine dependence  I have spent 25 minutes of face to face time with Mr. Burt Knack discussing the risks and benefits of lung cancer screening. We viewed a power point together that explained in detail the above noted topics. We paused at intervals to allow for questions to be asked and answered to ensure understanding.We discussed that the single most powerful action that he can take to decrease his risk of developing lung cancer is to quit smoking. We discussed whether or not he is ready to commit to setting a quit date. We discussed options for tools to aid in quitting smoking including nicotine replacement therapy, non-nicotine medications, support groups, Quit Smart classes, and behavior modification. We discussed that often times setting smaller, more achievable goals, such as eliminating 1 cigarette a day for a week and then 2 cigarettes a day for a week can be helpful in slowly decreasing the number of cigarettes smoked. This allows for a sense of accomplishment as well as providing a clinical benefit. I gave him the " Be Stronger Than Your Excuses" card with contact information for community resources, classes, free nicotine replacement therapy, and access to mobile apps, text messaging, and on-line smoking cessation help. I have also given him my card and contact information in the event he needs to contact me. We discussed the time and location of the scan, and that either Doroteo Glassman RN or I will call with the results within 24-48 hours of receiving them. I have offered him  a copy of the power point we viewed  as a resource in the event they need reinforcement of  the concepts we discussed today in the office. The patient verbalized understanding of all of  the above and had no further questions upon leaving the office. They have my  contact information in the event they have any further questions.  I spent 3 minutes counseling on smoking cessation and the health risks of continued tobacco abuse.  I explained to the patient that there has been a high incidence of coronary artery disease noted on these exams. I explained that this is a non-gated exam therefore degree or severity cannot be determined. This patient is currently on statin therapy. I have asked the patient to follow-up with their PCP regarding any incidental finding of coronary artery disease and management with diet or medication as their PCP  feels is clinically indicated. The patient verbalized understanding of the above and had no further questions upon completion of the visit.   Magdalen Spatz, NP 12/18/2020

## 2020-12-18 ENCOUNTER — Other Ambulatory Visit: Payer: Self-pay

## 2020-12-18 ENCOUNTER — Encounter: Payer: Self-pay | Admitting: Acute Care

## 2020-12-18 ENCOUNTER — Ambulatory Visit
Admission: RE | Admit: 2020-12-18 | Discharge: 2020-12-18 | Disposition: A | Discharge status: Home / Self Care | Payer: Medicare Other | Source: Ambulatory Visit | Attending: Acute Care | Admitting: Acute Care

## 2020-12-18 ENCOUNTER — Ambulatory Visit (INDEPENDENT_AMBULATORY_CARE_PROVIDER_SITE_OTHER): Payer: Medicare Other | Admitting: Acute Care

## 2020-12-18 VITALS — BP 124/84 | HR 73 | Temp 97.9°F | Ht 68.0 in | Wt 166.6 lb

## 2020-12-18 DIAGNOSIS — F17210 Nicotine dependence, cigarettes, uncomplicated: Secondary | ICD-10-CM

## 2020-12-18 DIAGNOSIS — Z87891 Personal history of nicotine dependence: Secondary | ICD-10-CM

## 2020-12-18 DIAGNOSIS — Z122 Encounter for screening for malignant neoplasm of respiratory organs: Secondary | ICD-10-CM

## 2020-12-18 NOTE — Patient Instructions (Signed)
Thank you for participating in the [facility]. It was our pleasure to meet you today. We will call you with the results of your scan within the next few days. Your scan will be assigned a Lung RADS category score by the physicians reading the scans.  This Lung RADS score determines follow up scanning.  See below for description of categories, and follow up screening recommendations. We will be in touch to schedule your follow up screening annually or based on recommendations of our providers. We will fax a copy of your scan results to your Primary Care Physician, or the physician who referred you to the program, to ensure they have the results. Please call the office if you have any questions or concerns regarding your scanning experience or results.  Our office number is [phone]. Please speak with Denise Phelps, RN. She is our Lung Cancer Screening RN. If she is unavailable when you call, please have the office staff send her a message. She will return your call at her earliest convenience. Remember, if your scan is normal, we will scan you annually as long as you continue to meet the criteria for the program. (Age 55-77, Current smoker or smoker who has quit within the last 15 years). If you are a smoker, remember, quitting is the single most powerful action that you can take to decrease your risk of lung cancer and other pulmonary, breathing related problems. We know quitting is hard, and we are here to help.  Please let us know if there is anything we can do to help you meet your goal of quitting. If you are a former smoker, congratulations. We are proud of you! Remain smoke free! Remember you can refer friends or family members through the number above.  We will screen them to make sure they meet criteria for the program. Thank you for helping us take better care of you by participating in Lung Screening.  Lung RADS Categories:  Lung RADS 1: no nodules  or definitely non-concerning nodules.  Recommendation is for a repeat annual scan in 12 months.  Lung RADS 2:  nodules that are non-concerning in appearance and behavior with a very low likelihood of becoming an active cancer. Recommendation is for a repeat annual scan in 12 months.  Lung RADS 3: nodules that are probably non-concerning , includes nodules with a low likelihood of becoming an active cancer.  Recommendation is for a 6-month repeat screening scan. Often noted after an upper respiratory illness. We will be in touch to make sure you have no questions, and to schedule your 6-month scan.  Lung RADS 4 A: nodules with concerning findings, recommendation is most often for a follow up scan in 3 months or additional testing based on our provider's assessment of the scan. We will be in touch to make sure you have no questions and to schedule the recommended 3 month follow up scan.  Lung RADS 4 B:  indicates findings that are concerning. We will be in touch with you to schedule additional diagnostic testing based on our provider's  assessment of the scan.   

## 2020-12-19 ENCOUNTER — Other Ambulatory Visit: Payer: Self-pay | Admitting: *Deleted

## 2020-12-19 DIAGNOSIS — Z87891 Personal history of nicotine dependence: Secondary | ICD-10-CM

## 2020-12-19 DIAGNOSIS — F17210 Nicotine dependence, cigarettes, uncomplicated: Secondary | ICD-10-CM

## 2020-12-19 NOTE — Progress Notes (Signed)
Please call patient and let them  know their  low dose Ct was read as a  Lung RADS 1, negative study: no nodules or definitely benign nodules. Radiology recommendation is for a repeat LDCT in 12 months. .Please let them  know we will order and schedule their  annual screening scan for 11/2021. Please let them  know there was notation of CAD on their  scan.  Please remind the patient  that this is a non-gated exam therefore degree or severity of disease  cannot be determined. Please have them  follow up with their PCP regarding potential risk factor modification, dietary therapy or pharmacologic therapy if clinically indicated. Pt.  is  currently on statin therapy. Please place order for annual  screening scan for  11/2021 and fax results to PCP. Thanks so much.  Please have patient follow up with PCP regarding CAD. Thanks so much

## 2020-12-20 ENCOUNTER — Ambulatory Visit: Payer: Medicare Other

## 2021-01-02 ENCOUNTER — Ambulatory Visit
Admission: RE | Admit: 2021-01-02 | Discharge: 2021-01-02 | Disposition: A | Discharge status: Home / Self Care | Payer: Medicare Other | Source: Ambulatory Visit | Attending: Family Medicine | Admitting: Family Medicine

## 2021-01-02 DIAGNOSIS — Z136 Encounter for screening for cardiovascular disorders: Secondary | ICD-10-CM | POA: Diagnosis not present

## 2021-01-02 DIAGNOSIS — Z87891 Personal history of nicotine dependence: Secondary | ICD-10-CM | POA: Diagnosis not present

## 2021-01-02 DIAGNOSIS — F17200 Nicotine dependence, unspecified, uncomplicated: Secondary | ICD-10-CM

## 2021-04-23 DIAGNOSIS — D3611 Benign neoplasm of peripheral nerves and autonomic nervous system of face, head, and neck: Secondary | ICD-10-CM | POA: Diagnosis not present

## 2021-04-23 DIAGNOSIS — L812 Freckles: Secondary | ICD-10-CM | POA: Diagnosis not present

## 2021-04-23 DIAGNOSIS — L821 Other seborrheic keratosis: Secondary | ICD-10-CM | POA: Diagnosis not present

## 2021-04-23 DIAGNOSIS — Z85828 Personal history of other malignant neoplasm of skin: Secondary | ICD-10-CM | POA: Diagnosis not present

## 2021-04-23 DIAGNOSIS — D225 Melanocytic nevi of trunk: Secondary | ICD-10-CM | POA: Diagnosis not present

## 2021-04-23 DIAGNOSIS — L720 Epidermal cyst: Secondary | ICD-10-CM | POA: Diagnosis not present

## 2021-07-23 IMAGING — CT CT CHEST LUNG CANCER SCREENING LOW DOSE W/O CM
1 series · 15 of 30 positions shown, 19 images · non-contrast
Comparison: None.

CLINICAL DATA: Current smoker, 48 pack-year history.

EXAM:
CT CHEST WITHOUT CONTRAST LOW-DOSE FOR LUNG CANCER SCREENING
TECHNIQUE: Multidetector CT imaging of the chest was performed following the
standard protocol without IV contrast.

[Series 2: ldct screening <30 bmi · axial · 0.69mm/px · z∈[-394,-79]mm · 15 of 69 slices shown, 19 images]
[im 3/69  mediastinal]
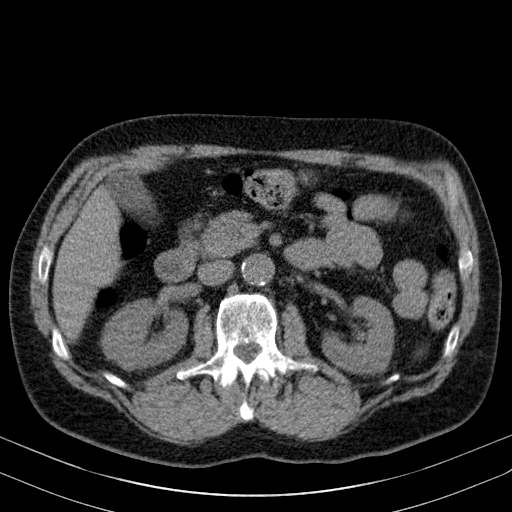
[im 3/69  lung]
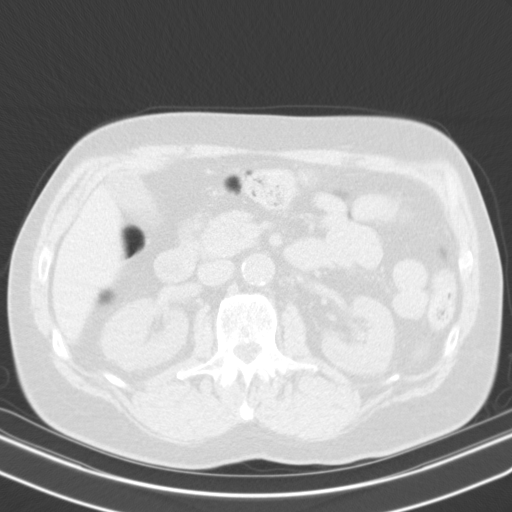
[im 8/69  lung]
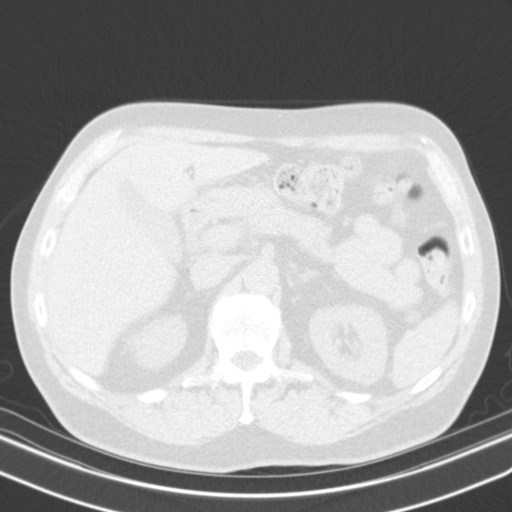
[im 13/69  lung]
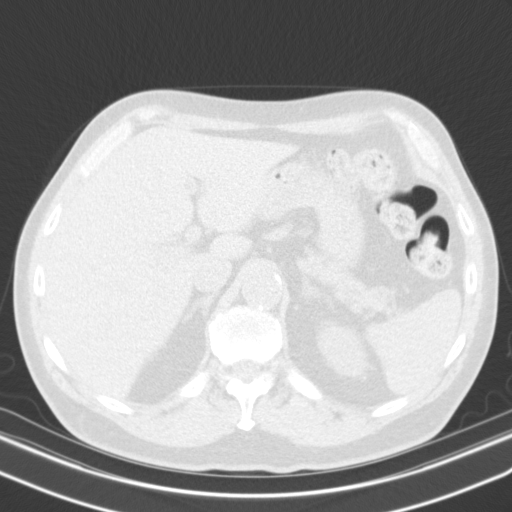
[im 16/69  lung]
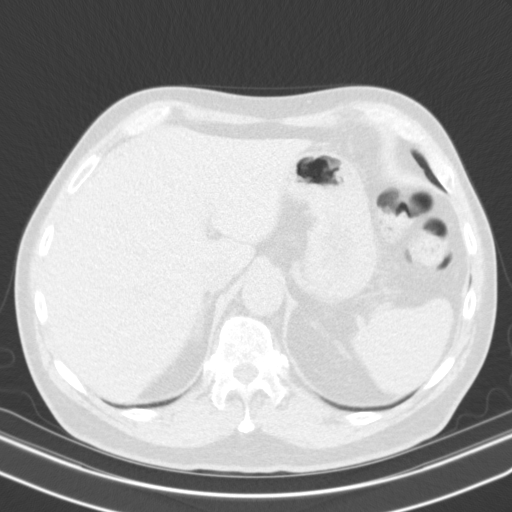
[im 21/69  mediastinal]
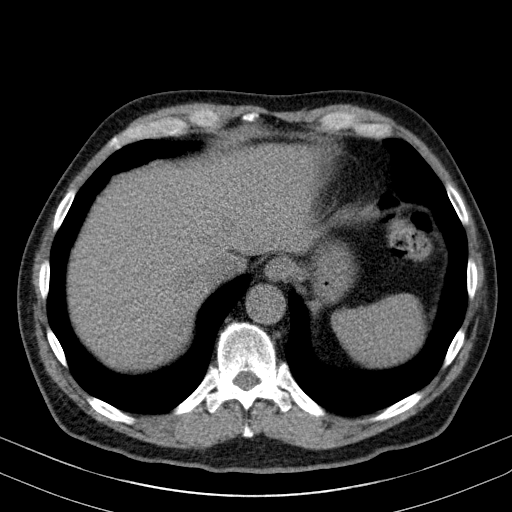
[im 21/69  lung]
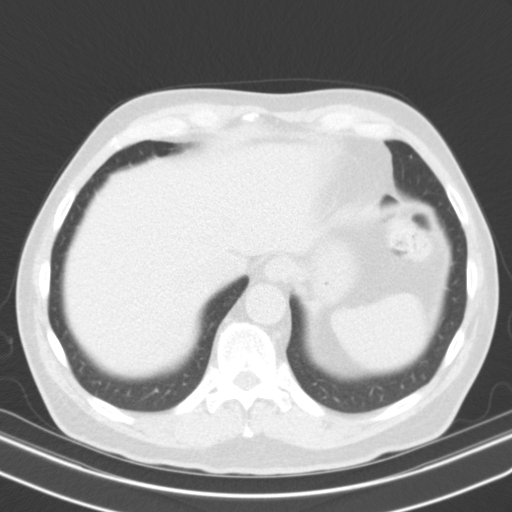
[im 26/69  lung]
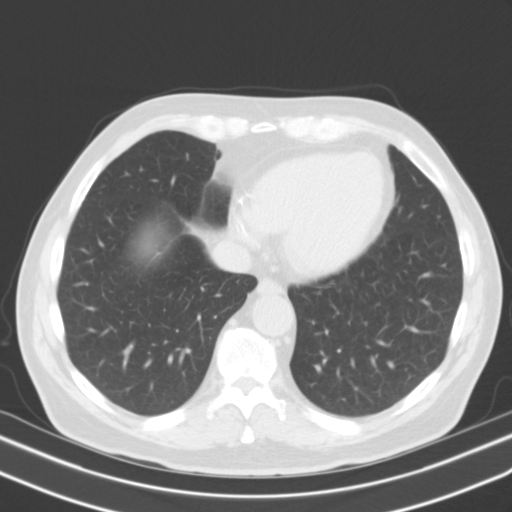
[im 31/69  lung]
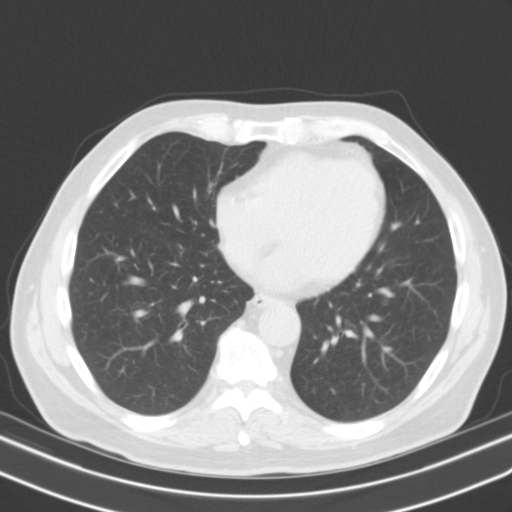
[im 36/69  lung]
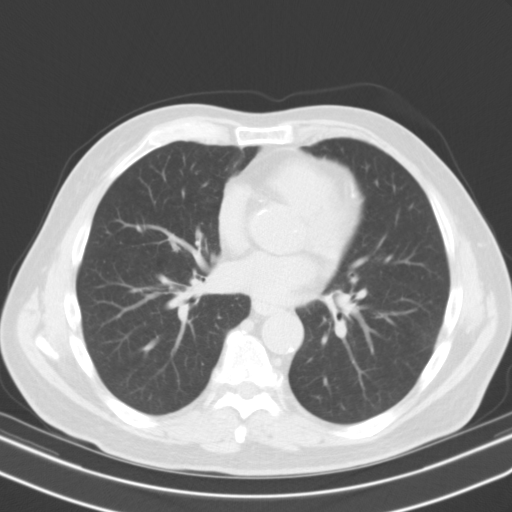
[im 38/69  mediastinal]
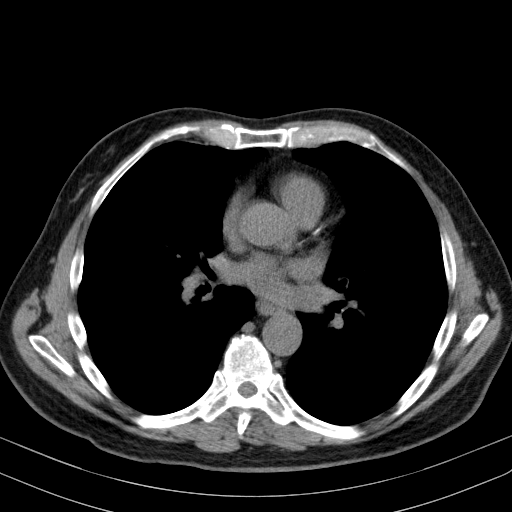
[im 38/69  lung]
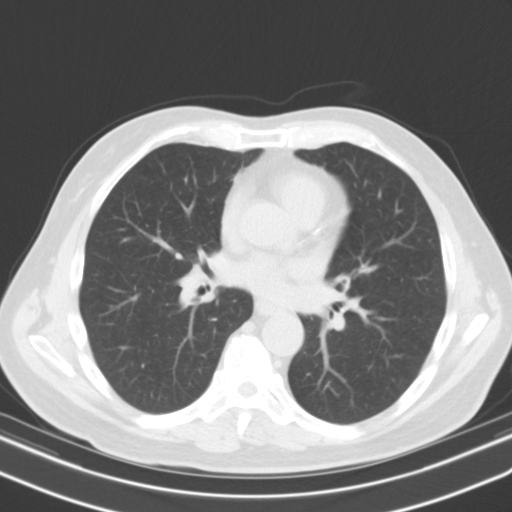
[im 43/69  lung]
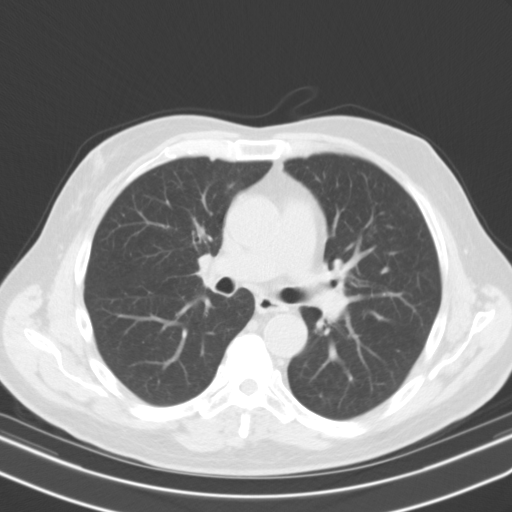
[im 48/69  lung]
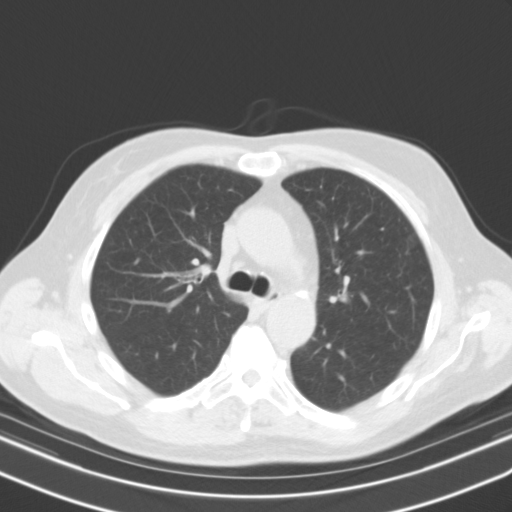
[im 53/69  lung]
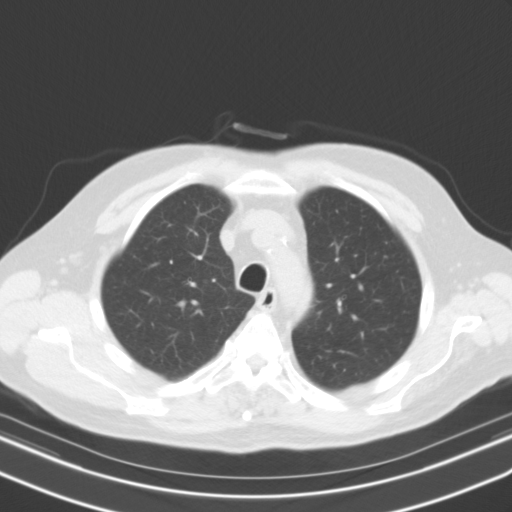
[im 56/69  mediastinal]
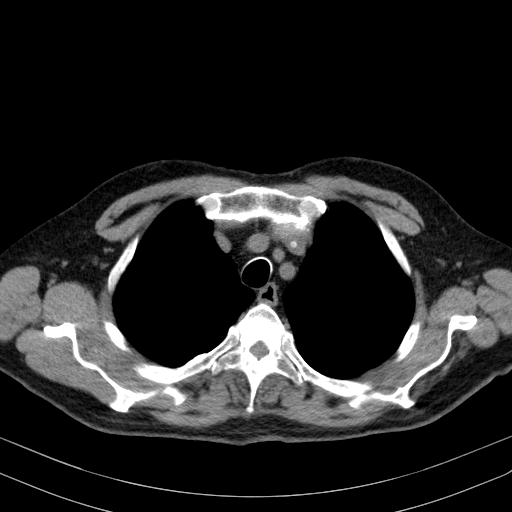
[im 56/69  lung]
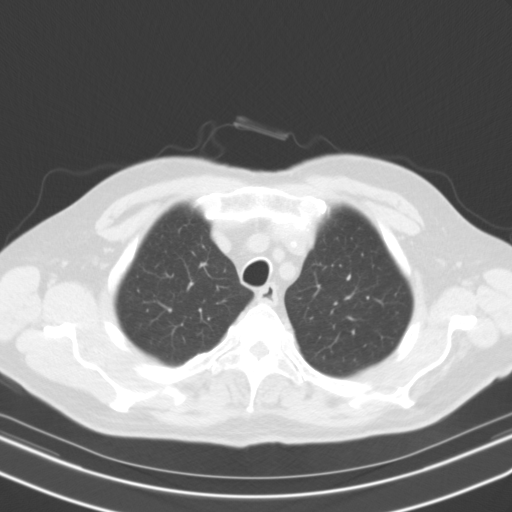
[im 61/69  lung]
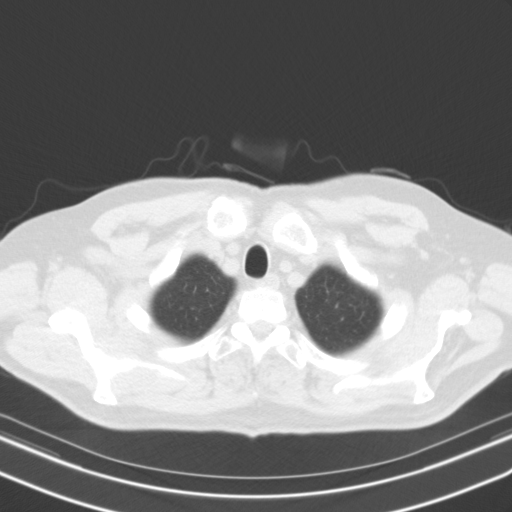
[im 66/69  lung]
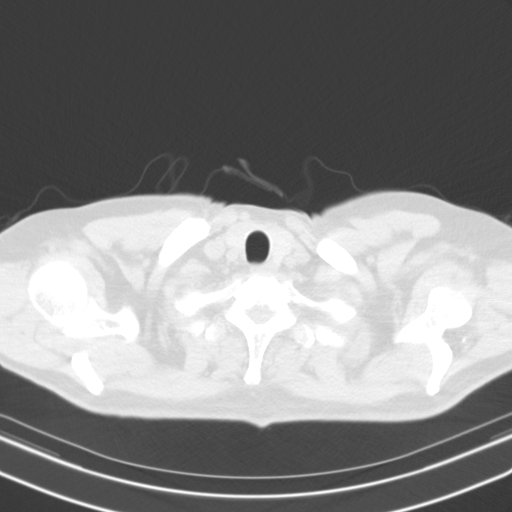

[15 of 30 positions shown; findings below may reference images not displayed]

FINDINGS: Cardiovascular: Atherosclerotic calcification of the aorta, aortic
valve and coronary arteries. Heart size normal. No pericardial
effusion.

Mediastinum/Nodes: No pathologically enlarged mediastinal or
axillary lymph nodes. Hilar regions are difficult to definitively
evaluate without IV contrast. Esophagus is grossly unremarkable.

Lungs/Pleura: Smoking related respiratory bronchiolitis. Mild
centrilobular emphysema. Peribronchial thickening. No suspicious
pulmonary nodules. No pleural fluid. Minimal adherent debris in the
airway.

Upper Abdomen: Visualized portions of the liver, gallbladder,
adrenal glands, kidneys, spleen, pancreas, stomach and bowel are
grossly unremarkable.

Musculoskeletal: Degenerative changes in the spine.
IMPRESSION: 1. Lung-RADS 1, negative. Continue annual screening with low-dose
chest CT without contrast in 12 months.
2. Aortic atherosclerosis ([id number]). Coronary artery
calcification.
3.  Emphysema ([id number]).

## 2021-09-10 DIAGNOSIS — L821 Other seborrheic keratosis: Secondary | ICD-10-CM | POA: Diagnosis not present

## 2021-09-10 DIAGNOSIS — Z85828 Personal history of other malignant neoplasm of skin: Secondary | ICD-10-CM | POA: Diagnosis not present

## 2021-10-22 DIAGNOSIS — H2513 Age-related nuclear cataract, bilateral: Secondary | ICD-10-CM | POA: Diagnosis not present

## 2021-10-22 DIAGNOSIS — H5213 Myopia, bilateral: Secondary | ICD-10-CM | POA: Diagnosis not present

## 2021-10-22 DIAGNOSIS — H524 Presbyopia: Secondary | ICD-10-CM | POA: Diagnosis not present

## 2021-11-07 DIAGNOSIS — M11861 Other specified crystal arthropathies, right knee: Secondary | ICD-10-CM | POA: Diagnosis not present

## 2021-11-07 DIAGNOSIS — M1712 Unilateral primary osteoarthritis, left knee: Secondary | ICD-10-CM | POA: Diagnosis not present

## 2021-11-07 DIAGNOSIS — M545 Low back pain, unspecified: Secondary | ICD-10-CM | POA: Diagnosis not present

## 2022-02-05 DIAGNOSIS — F3181 Bipolar II disorder: Secondary | ICD-10-CM | POA: Diagnosis not present

## 2022-02-05 DIAGNOSIS — Z79899 Other long term (current) drug therapy: Secondary | ICD-10-CM | POA: Diagnosis not present

## 2022-04-23 DIAGNOSIS — D225 Melanocytic nevi of trunk: Secondary | ICD-10-CM | POA: Diagnosis not present

## 2022-04-23 DIAGNOSIS — L821 Other seborrheic keratosis: Secondary | ICD-10-CM | POA: Diagnosis not present

## 2022-04-23 DIAGNOSIS — Z85828 Personal history of other malignant neoplasm of skin: Secondary | ICD-10-CM | POA: Diagnosis not present

## 2022-04-23 DIAGNOSIS — L812 Freckles: Secondary | ICD-10-CM | POA: Diagnosis not present

## 2022-04-24 DIAGNOSIS — E7800 Pure hypercholesterolemia, unspecified: Secondary | ICD-10-CM | POA: Diagnosis not present

## 2022-04-24 DIAGNOSIS — Z0000 Encounter for general adult medical examination without abnormal findings: Secondary | ICD-10-CM | POA: Diagnosis not present

## 2022-04-24 DIAGNOSIS — F17210 Nicotine dependence, cigarettes, uncomplicated: Secondary | ICD-10-CM | POA: Diagnosis not present

## 2022-04-24 DIAGNOSIS — I700 Atherosclerosis of aorta: Secondary | ICD-10-CM | POA: Diagnosis not present

## 2022-04-24 DIAGNOSIS — Z125 Encounter for screening for malignant neoplasm of prostate: Secondary | ICD-10-CM | POA: Diagnosis not present

## 2022-04-24 DIAGNOSIS — Z8546 Personal history of malignant neoplasm of prostate: Secondary | ICD-10-CM | POA: Diagnosis not present

## 2022-04-26 ENCOUNTER — Telehealth: Payer: Self-pay | Admitting: *Deleted

## 2022-04-26 NOTE — Telephone Encounter (Signed)
Left message for patient to call back to schedule follow up lung cancer screening CT scan.  

## 2022-05-28 ENCOUNTER — Other Ambulatory Visit: Payer: Self-pay

## 2022-05-28 DIAGNOSIS — F17210 Nicotine dependence, cigarettes, uncomplicated: Secondary | ICD-10-CM

## 2022-05-28 DIAGNOSIS — Z122 Encounter for screening for malignant neoplasm of respiratory organs: Secondary | ICD-10-CM

## 2022-05-28 DIAGNOSIS — Z87891 Personal history of nicotine dependence: Secondary | ICD-10-CM

## 2022-05-28 DIAGNOSIS — H25813 Combined forms of age-related cataract, bilateral: Secondary | ICD-10-CM | POA: Diagnosis not present

## 2022-06-05 DIAGNOSIS — H25813 Combined forms of age-related cataract, bilateral: Secondary | ICD-10-CM | POA: Diagnosis not present

## 2022-06-18 ENCOUNTER — Ambulatory Visit
Admission: RE | Admit: 2022-06-18 | Discharge: 2022-06-18 | Disposition: A | Discharge status: Home / Self Care | Payer: Medicare Other | Source: Ambulatory Visit | Attending: Acute Care | Admitting: Acute Care

## 2022-06-18 DIAGNOSIS — F17210 Nicotine dependence, cigarettes, uncomplicated: Secondary | ICD-10-CM

## 2022-06-18 DIAGNOSIS — Z122 Encounter for screening for malignant neoplasm of respiratory organs: Secondary | ICD-10-CM

## 2022-06-18 DIAGNOSIS — I700 Atherosclerosis of aorta: Secondary | ICD-10-CM | POA: Diagnosis not present

## 2022-06-18 DIAGNOSIS — Z87891 Personal history of nicotine dependence: Secondary | ICD-10-CM

## 2022-06-18 DIAGNOSIS — J439 Emphysema, unspecified: Secondary | ICD-10-CM | POA: Diagnosis not present

## 2022-06-18 DIAGNOSIS — I2510 Atherosclerotic heart disease of native coronary artery without angina pectoris: Secondary | ICD-10-CM | POA: Diagnosis not present

## 2022-06-24 ENCOUNTER — Other Ambulatory Visit: Payer: Self-pay

## 2022-06-24 DIAGNOSIS — Z87891 Personal history of nicotine dependence: Secondary | ICD-10-CM

## 2022-06-24 DIAGNOSIS — Z122 Encounter for screening for malignant neoplasm of respiratory organs: Secondary | ICD-10-CM

## 2022-06-24 DIAGNOSIS — F17210 Nicotine dependence, cigarettes, uncomplicated: Secondary | ICD-10-CM

## 2022-06-26 DIAGNOSIS — H2511 Age-related nuclear cataract, right eye: Secondary | ICD-10-CM | POA: Diagnosis not present

## 2022-06-26 DIAGNOSIS — H25811 Combined forms of age-related cataract, right eye: Secondary | ICD-10-CM | POA: Diagnosis not present

## 2022-07-03 DIAGNOSIS — H25812 Combined forms of age-related cataract, left eye: Secondary | ICD-10-CM | POA: Diagnosis not present

## 2022-07-03 DIAGNOSIS — H2512 Age-related nuclear cataract, left eye: Secondary | ICD-10-CM | POA: Diagnosis not present

## 2022-11-25 DIAGNOSIS — D3617 Benign neoplasm of peripheral nerves and autonomic nervous system of trunk, unspecified: Secondary | ICD-10-CM | POA: Diagnosis not present

## 2022-11-25 DIAGNOSIS — L570 Actinic keratosis: Secondary | ICD-10-CM | POA: Diagnosis not present

## 2022-11-25 DIAGNOSIS — C4441 Basal cell carcinoma of skin of scalp and neck: Secondary | ICD-10-CM | POA: Diagnosis not present

## 2022-11-25 DIAGNOSIS — Z85828 Personal history of other malignant neoplasm of skin: Secondary | ICD-10-CM | POA: Diagnosis not present

## 2022-12-06 DIAGNOSIS — Z125 Encounter for screening for malignant neoplasm of prostate: Secondary | ICD-10-CM | POA: Diagnosis not present

## 2022-12-06 DIAGNOSIS — R3915 Urgency of urination: Secondary | ICD-10-CM | POA: Diagnosis not present

## 2022-12-06 DIAGNOSIS — N401 Enlarged prostate with lower urinary tract symptoms: Secondary | ICD-10-CM | POA: Diagnosis not present

## 2022-12-19 DIAGNOSIS — M1712 Unilateral primary osteoarthritis, left knee: Secondary | ICD-10-CM | POA: Diagnosis not present

## 2022-12-19 DIAGNOSIS — M1711 Unilateral primary osteoarthritis, right knee: Secondary | ICD-10-CM | POA: Diagnosis not present

## 2022-12-19 DIAGNOSIS — M25512 Pain in left shoulder: Secondary | ICD-10-CM | POA: Diagnosis not present

## 2022-12-19 DIAGNOSIS — M25511 Pain in right shoulder: Secondary | ICD-10-CM | POA: Diagnosis not present

## 2022-12-19 DIAGNOSIS — M170 Bilateral primary osteoarthritis of knee: Secondary | ICD-10-CM | POA: Diagnosis not present

## 2023-01-08 DIAGNOSIS — F3181 Bipolar II disorder: Secondary | ICD-10-CM | POA: Diagnosis not present

## 2023-01-08 DIAGNOSIS — Z5181 Encounter for therapeutic drug level monitoring: Secondary | ICD-10-CM | POA: Diagnosis not present

## 2023-01-23 DIAGNOSIS — E7800 Pure hypercholesterolemia, unspecified: Secondary | ICD-10-CM | POA: Diagnosis not present

## 2023-01-23 DIAGNOSIS — L02511 Cutaneous abscess of right hand: Secondary | ICD-10-CM | POA: Diagnosis not present

## 2023-02-24 DIAGNOSIS — C44319 Basal cell carcinoma of skin of other parts of face: Secondary | ICD-10-CM | POA: Diagnosis not present

## 2023-02-24 DIAGNOSIS — L570 Actinic keratosis: Secondary | ICD-10-CM | POA: Diagnosis not present

## 2023-02-24 DIAGNOSIS — C4442 Squamous cell carcinoma of skin of scalp and neck: Secondary | ICD-10-CM | POA: Diagnosis not present

## 2023-03-19 DIAGNOSIS — M1712 Unilateral primary osteoarthritis, left knee: Secondary | ICD-10-CM | POA: Diagnosis not present

## 2023-04-03 DIAGNOSIS — C44311 Basal cell carcinoma of skin of nose: Secondary | ICD-10-CM | POA: Diagnosis not present

## 2023-04-16 DIAGNOSIS — M1712 Unilateral primary osteoarthritis, left knee: Secondary | ICD-10-CM | POA: Diagnosis not present

## 2023-04-23 DIAGNOSIS — M1712 Unilateral primary osteoarthritis, left knee: Secondary | ICD-10-CM | POA: Diagnosis not present

## 2023-04-30 DIAGNOSIS — M1712 Unilateral primary osteoarthritis, left knee: Secondary | ICD-10-CM | POA: Diagnosis not present

## 2023-05-15 DIAGNOSIS — E7800 Pure hypercholesterolemia, unspecified: Secondary | ICD-10-CM | POA: Diagnosis not present

## 2023-05-15 DIAGNOSIS — Z0000 Encounter for general adult medical examination without abnormal findings: Secondary | ICD-10-CM | POA: Diagnosis not present

## 2023-05-15 DIAGNOSIS — Z125 Encounter for screening for malignant neoplasm of prostate: Secondary | ICD-10-CM | POA: Diagnosis not present

## 2023-05-15 DIAGNOSIS — R7303 Prediabetes: Secondary | ICD-10-CM | POA: Diagnosis not present

## 2023-05-15 DIAGNOSIS — Z23 Encounter for immunization: Secondary | ICD-10-CM | POA: Diagnosis not present

## 2023-05-15 DIAGNOSIS — F331 Major depressive disorder, recurrent, moderate: Secondary | ICD-10-CM | POA: Diagnosis not present

## 2023-05-16 ENCOUNTER — Encounter: Payer: Self-pay | Admitting: Acute Care

## 2023-05-21 DIAGNOSIS — L812 Freckles: Secondary | ICD-10-CM | POA: Diagnosis not present

## 2023-05-21 DIAGNOSIS — D225 Melanocytic nevi of trunk: Secondary | ICD-10-CM | POA: Diagnosis not present

## 2023-05-21 DIAGNOSIS — Z85828 Personal history of other malignant neoplasm of skin: Secondary | ICD-10-CM | POA: Diagnosis not present

## 2023-05-21 DIAGNOSIS — L570 Actinic keratosis: Secondary | ICD-10-CM | POA: Diagnosis not present

## 2023-06-11 ENCOUNTER — Other Ambulatory Visit: Payer: Self-pay | Admitting: Family Medicine

## 2023-06-11 DIAGNOSIS — M170 Bilateral primary osteoarthritis of knee: Secondary | ICD-10-CM | POA: Diagnosis not present

## 2023-06-11 DIAGNOSIS — M1711 Unilateral primary osteoarthritis, right knee: Secondary | ICD-10-CM | POA: Diagnosis not present

## 2023-06-11 DIAGNOSIS — R946 Abnormal results of thyroid function studies: Secondary | ICD-10-CM | POA: Diagnosis not present

## 2023-06-11 DIAGNOSIS — R4189 Other symptoms and signs involving cognitive functions and awareness: Secondary | ICD-10-CM | POA: Diagnosis not present

## 2023-06-11 DIAGNOSIS — M1712 Unilateral primary osteoarthritis, left knee: Secondary | ICD-10-CM | POA: Diagnosis not present

## 2023-06-20 ENCOUNTER — Other Ambulatory Visit: Payer: Medicare Other

## 2023-06-21 ENCOUNTER — Other Ambulatory Visit: Payer: Medicare Other

## 2023-06-24 ENCOUNTER — Ambulatory Visit
Admission: RE | Admit: 2023-06-24 | Discharge: 2023-06-24 | Disposition: A | Discharge status: Home / Self Care | Payer: Medicare Other | Source: Ambulatory Visit | Attending: Acute Care | Admitting: Acute Care

## 2023-06-24 DIAGNOSIS — F17210 Nicotine dependence, cigarettes, uncomplicated: Secondary | ICD-10-CM

## 2023-06-24 DIAGNOSIS — Z87891 Personal history of nicotine dependence: Secondary | ICD-10-CM

## 2023-06-24 DIAGNOSIS — Z122 Encounter for screening for malignant neoplasm of respiratory organs: Secondary | ICD-10-CM

## 2023-07-02 ENCOUNTER — Encounter: Payer: Self-pay | Admitting: Family Medicine

## 2023-07-08 ENCOUNTER — Ambulatory Visit
Admission: RE | Admit: 2023-07-08 | Discharge: 2023-07-08 | Disposition: A | Discharge status: Home / Self Care | Payer: Medicare Other | Source: Ambulatory Visit | Attending: Family Medicine | Admitting: Family Medicine

## 2023-07-08 DIAGNOSIS — R4189 Other symptoms and signs involving cognitive functions and awareness: Secondary | ICD-10-CM

## 2023-07-28 ENCOUNTER — Other Ambulatory Visit: Payer: Self-pay | Admitting: Acute Care

## 2023-07-28 DIAGNOSIS — F17210 Nicotine dependence, cigarettes, uncomplicated: Secondary | ICD-10-CM

## 2023-07-28 DIAGNOSIS — Z87891 Personal history of nicotine dependence: Secondary | ICD-10-CM

## 2023-07-28 DIAGNOSIS — Z122 Encounter for screening for malignant neoplasm of respiratory organs: Secondary | ICD-10-CM

## 2023-08-04 DIAGNOSIS — R262 Difficulty in walking, not elsewhere classified: Secondary | ICD-10-CM | POA: Diagnosis not present

## 2023-08-04 DIAGNOSIS — M1732 Unilateral post-traumatic osteoarthritis, left knee: Secondary | ICD-10-CM | POA: Diagnosis not present

## 2023-08-04 DIAGNOSIS — M25662 Stiffness of left knee, not elsewhere classified: Secondary | ICD-10-CM | POA: Diagnosis not present

## 2023-08-06 DIAGNOSIS — D4989 Neoplasm of unspecified behavior of other specified sites: Secondary | ICD-10-CM | POA: Diagnosis not present

## 2023-08-06 DIAGNOSIS — Z961 Presence of intraocular lens: Secondary | ICD-10-CM | POA: Diagnosis not present

## 2023-09-12 DIAGNOSIS — M25562 Pain in left knee: Secondary | ICD-10-CM | POA: Diagnosis not present

## 2023-09-12 DIAGNOSIS — M170 Bilateral primary osteoarthritis of knee: Secondary | ICD-10-CM | POA: Diagnosis not present

## 2023-09-16 DIAGNOSIS — M25662 Stiffness of left knee, not elsewhere classified: Secondary | ICD-10-CM | POA: Diagnosis not present

## 2023-09-16 DIAGNOSIS — M1732 Unilateral post-traumatic osteoarthritis, left knee: Secondary | ICD-10-CM | POA: Diagnosis not present

## 2023-09-16 DIAGNOSIS — R262 Difficulty in walking, not elsewhere classified: Secondary | ICD-10-CM | POA: Diagnosis not present

## 2023-09-25 DIAGNOSIS — M1712 Unilateral primary osteoarthritis, left knee: Secondary | ICD-10-CM | POA: Diagnosis not present

## 2023-09-25 DIAGNOSIS — Z96652 Presence of left artificial knee joint: Secondary | ICD-10-CM | POA: Diagnosis not present

## 2023-09-25 DIAGNOSIS — G8918 Other acute postprocedural pain: Secondary | ICD-10-CM | POA: Diagnosis not present

## 2023-09-29 DIAGNOSIS — Z96652 Presence of left artificial knee joint: Secondary | ICD-10-CM | POA: Diagnosis not present

## 2023-09-29 DIAGNOSIS — M6281 Muscle weakness (generalized): Secondary | ICD-10-CM | POA: Diagnosis not present

## 2023-09-29 DIAGNOSIS — M25662 Stiffness of left knee, not elsewhere classified: Secondary | ICD-10-CM | POA: Diagnosis not present

## 2023-10-01 DIAGNOSIS — M6281 Muscle weakness (generalized): Secondary | ICD-10-CM | POA: Diagnosis not present

## 2023-10-01 DIAGNOSIS — M25662 Stiffness of left knee, not elsewhere classified: Secondary | ICD-10-CM | POA: Diagnosis not present

## 2023-10-01 DIAGNOSIS — Z96652 Presence of left artificial knee joint: Secondary | ICD-10-CM | POA: Diagnosis not present

## 2023-10-02 DIAGNOSIS — Z96652 Presence of left artificial knee joint: Secondary | ICD-10-CM | POA: Diagnosis not present

## 2023-10-02 DIAGNOSIS — M6281 Muscle weakness (generalized): Secondary | ICD-10-CM | POA: Diagnosis not present

## 2023-10-02 DIAGNOSIS — M25662 Stiffness of left knee, not elsewhere classified: Secondary | ICD-10-CM | POA: Diagnosis not present

## 2023-10-06 DIAGNOSIS — M25662 Stiffness of left knee, not elsewhere classified: Secondary | ICD-10-CM | POA: Diagnosis not present

## 2023-10-06 DIAGNOSIS — M6281 Muscle weakness (generalized): Secondary | ICD-10-CM | POA: Diagnosis not present

## 2023-10-06 DIAGNOSIS — M1712 Unilateral primary osteoarthritis, left knee: Secondary | ICD-10-CM | POA: Diagnosis not present

## 2023-10-06 DIAGNOSIS — Z96652 Presence of left artificial knee joint: Secondary | ICD-10-CM | POA: Diagnosis not present

## 2023-10-08 DIAGNOSIS — Z96652 Presence of left artificial knee joint: Secondary | ICD-10-CM | POA: Diagnosis not present

## 2023-10-08 DIAGNOSIS — M25662 Stiffness of left knee, not elsewhere classified: Secondary | ICD-10-CM | POA: Diagnosis not present

## 2023-10-08 DIAGNOSIS — M6281 Muscle weakness (generalized): Secondary | ICD-10-CM | POA: Diagnosis not present

## 2023-10-09 DIAGNOSIS — M6281 Muscle weakness (generalized): Secondary | ICD-10-CM | POA: Diagnosis not present

## 2023-10-09 DIAGNOSIS — Z96652 Presence of left artificial knee joint: Secondary | ICD-10-CM | POA: Diagnosis not present

## 2023-10-09 DIAGNOSIS — M25662 Stiffness of left knee, not elsewhere classified: Secondary | ICD-10-CM | POA: Diagnosis not present

## 2023-10-14 DIAGNOSIS — Z96652 Presence of left artificial knee joint: Secondary | ICD-10-CM | POA: Diagnosis not present

## 2023-10-14 DIAGNOSIS — M25662 Stiffness of left knee, not elsewhere classified: Secondary | ICD-10-CM | POA: Diagnosis not present

## 2023-10-14 DIAGNOSIS — M6281 Muscle weakness (generalized): Secondary | ICD-10-CM | POA: Diagnosis not present

## 2023-10-16 DIAGNOSIS — M25662 Stiffness of left knee, not elsewhere classified: Secondary | ICD-10-CM | POA: Diagnosis not present

## 2023-10-16 DIAGNOSIS — M6281 Muscle weakness (generalized): Secondary | ICD-10-CM | POA: Diagnosis not present

## 2023-10-16 DIAGNOSIS — Z96652 Presence of left artificial knee joint: Secondary | ICD-10-CM | POA: Diagnosis not present

## 2023-10-21 DIAGNOSIS — M25662 Stiffness of left knee, not elsewhere classified: Secondary | ICD-10-CM | POA: Diagnosis not present

## 2023-10-21 DIAGNOSIS — Z96652 Presence of left artificial knee joint: Secondary | ICD-10-CM | POA: Diagnosis not present

## 2023-10-21 DIAGNOSIS — M6281 Muscle weakness (generalized): Secondary | ICD-10-CM | POA: Diagnosis not present

## 2023-10-23 DIAGNOSIS — M25662 Stiffness of left knee, not elsewhere classified: Secondary | ICD-10-CM | POA: Diagnosis not present

## 2023-10-23 DIAGNOSIS — Z96652 Presence of left artificial knee joint: Secondary | ICD-10-CM | POA: Diagnosis not present

## 2023-10-23 DIAGNOSIS — M6281 Muscle weakness (generalized): Secondary | ICD-10-CM | POA: Diagnosis not present

## 2023-10-28 DIAGNOSIS — Z96652 Presence of left artificial knee joint: Secondary | ICD-10-CM | POA: Diagnosis not present

## 2023-10-28 DIAGNOSIS — M25662 Stiffness of left knee, not elsewhere classified: Secondary | ICD-10-CM | POA: Diagnosis not present

## 2023-10-28 DIAGNOSIS — M6281 Muscle weakness (generalized): Secondary | ICD-10-CM | POA: Diagnosis not present

## 2023-10-30 DIAGNOSIS — M6281 Muscle weakness (generalized): Secondary | ICD-10-CM | POA: Diagnosis not present

## 2023-10-30 DIAGNOSIS — M25662 Stiffness of left knee, not elsewhere classified: Secondary | ICD-10-CM | POA: Diagnosis not present

## 2023-10-30 DIAGNOSIS — Z96652 Presence of left artificial knee joint: Secondary | ICD-10-CM | POA: Diagnosis not present

## 2023-11-04 DIAGNOSIS — M25662 Stiffness of left knee, not elsewhere classified: Secondary | ICD-10-CM | POA: Diagnosis not present

## 2023-11-04 DIAGNOSIS — M6281 Muscle weakness (generalized): Secondary | ICD-10-CM | POA: Diagnosis not present

## 2023-11-04 DIAGNOSIS — Z96652 Presence of left artificial knee joint: Secondary | ICD-10-CM | POA: Diagnosis not present

## 2023-11-06 DIAGNOSIS — M25662 Stiffness of left knee, not elsewhere classified: Secondary | ICD-10-CM | POA: Diagnosis not present

## 2023-11-06 DIAGNOSIS — M6281 Muscle weakness (generalized): Secondary | ICD-10-CM | POA: Diagnosis not present

## 2023-11-06 DIAGNOSIS — Z96652 Presence of left artificial knee joint: Secondary | ICD-10-CM | POA: Diagnosis not present

## 2023-11-11 DIAGNOSIS — M25662 Stiffness of left knee, not elsewhere classified: Secondary | ICD-10-CM | POA: Diagnosis not present

## 2023-11-11 DIAGNOSIS — Z96652 Presence of left artificial knee joint: Secondary | ICD-10-CM | POA: Diagnosis not present

## 2023-11-11 DIAGNOSIS — M6281 Muscle weakness (generalized): Secondary | ICD-10-CM | POA: Diagnosis not present

## 2023-11-13 DIAGNOSIS — Z09 Encounter for follow-up examination after completed treatment for conditions other than malignant neoplasm: Secondary | ICD-10-CM | POA: Diagnosis not present

## 2023-11-13 DIAGNOSIS — Z860101 Personal history of adenomatous and serrated colon polyps: Secondary | ICD-10-CM | POA: Diagnosis not present

## 2023-11-13 DIAGNOSIS — Z980 Intestinal bypass and anastomosis status: Secondary | ICD-10-CM | POA: Diagnosis not present

## 2023-11-18 DIAGNOSIS — L570 Actinic keratosis: Secondary | ICD-10-CM | POA: Diagnosis not present

## 2023-11-18 DIAGNOSIS — M6281 Muscle weakness (generalized): Secondary | ICD-10-CM | POA: Diagnosis not present

## 2023-11-18 DIAGNOSIS — F3181 Bipolar II disorder: Secondary | ICD-10-CM | POA: Diagnosis not present

## 2023-11-18 DIAGNOSIS — Z96652 Presence of left artificial knee joint: Secondary | ICD-10-CM | POA: Diagnosis not present

## 2023-11-18 DIAGNOSIS — L2089 Other atopic dermatitis: Secondary | ICD-10-CM | POA: Diagnosis not present

## 2023-11-18 DIAGNOSIS — C44319 Basal cell carcinoma of skin of other parts of face: Secondary | ICD-10-CM | POA: Diagnosis not present

## 2023-11-18 DIAGNOSIS — L812 Freckles: Secondary | ICD-10-CM | POA: Diagnosis not present

## 2023-11-18 DIAGNOSIS — M25662 Stiffness of left knee, not elsewhere classified: Secondary | ICD-10-CM | POA: Diagnosis not present

## 2023-11-18 DIAGNOSIS — L821 Other seborrheic keratosis: Secondary | ICD-10-CM | POA: Diagnosis not present

## 2023-11-18 DIAGNOSIS — Z85828 Personal history of other malignant neoplasm of skin: Secondary | ICD-10-CM | POA: Diagnosis not present

## 2023-11-20 DIAGNOSIS — Z96652 Presence of left artificial knee joint: Secondary | ICD-10-CM | POA: Diagnosis not present

## 2023-11-20 DIAGNOSIS — M6281 Muscle weakness (generalized): Secondary | ICD-10-CM | POA: Diagnosis not present

## 2023-11-20 DIAGNOSIS — M25662 Stiffness of left knee, not elsewhere classified: Secondary | ICD-10-CM | POA: Diagnosis not present

## 2023-11-24 DIAGNOSIS — D4989 Neoplasm of unspecified behavior of other specified sites: Secondary | ICD-10-CM | POA: Diagnosis not present

## 2023-11-24 DIAGNOSIS — D0922 Carcinoma in situ of left eye: Secondary | ICD-10-CM | POA: Diagnosis not present

## 2023-12-03 DIAGNOSIS — Z96652 Presence of left artificial knee joint: Secondary | ICD-10-CM | POA: Diagnosis not present

## 2023-12-03 DIAGNOSIS — M25662 Stiffness of left knee, not elsewhere classified: Secondary | ICD-10-CM | POA: Diagnosis not present

## 2023-12-03 DIAGNOSIS — M6281 Muscle weakness (generalized): Secondary | ICD-10-CM | POA: Diagnosis not present

## 2023-12-09 DIAGNOSIS — Z96652 Presence of left artificial knee joint: Secondary | ICD-10-CM | POA: Diagnosis not present

## 2023-12-09 DIAGNOSIS — M6281 Muscle weakness (generalized): Secondary | ICD-10-CM | POA: Diagnosis not present

## 2023-12-09 DIAGNOSIS — M25662 Stiffness of left knee, not elsewhere classified: Secondary | ICD-10-CM | POA: Diagnosis not present

## 2023-12-11 DIAGNOSIS — M25662 Stiffness of left knee, not elsewhere classified: Secondary | ICD-10-CM | POA: Diagnosis not present

## 2023-12-11 DIAGNOSIS — M6281 Muscle weakness (generalized): Secondary | ICD-10-CM | POA: Diagnosis not present

## 2023-12-11 DIAGNOSIS — Z96652 Presence of left artificial knee joint: Secondary | ICD-10-CM | POA: Diagnosis not present

## 2023-12-16 DIAGNOSIS — M6281 Muscle weakness (generalized): Secondary | ICD-10-CM | POA: Diagnosis not present

## 2023-12-16 DIAGNOSIS — M25662 Stiffness of left knee, not elsewhere classified: Secondary | ICD-10-CM | POA: Diagnosis not present

## 2023-12-16 DIAGNOSIS — Z96652 Presence of left artificial knee joint: Secondary | ICD-10-CM | POA: Diagnosis not present

## 2023-12-18 DIAGNOSIS — M6281 Muscle weakness (generalized): Secondary | ICD-10-CM | POA: Diagnosis not present

## 2023-12-18 DIAGNOSIS — M25662 Stiffness of left knee, not elsewhere classified: Secondary | ICD-10-CM | POA: Diagnosis not present

## 2023-12-18 DIAGNOSIS — Z96652 Presence of left artificial knee joint: Secondary | ICD-10-CM | POA: Diagnosis not present

## 2023-12-24 DIAGNOSIS — M25662 Stiffness of left knee, not elsewhere classified: Secondary | ICD-10-CM | POA: Diagnosis not present

## 2024-01-01 DIAGNOSIS — C44319 Basal cell carcinoma of skin of other parts of face: Secondary | ICD-10-CM | POA: Diagnosis not present

## 2024-05-19 DIAGNOSIS — D225 Melanocytic nevi of trunk: Secondary | ICD-10-CM | POA: Diagnosis not present

## 2024-05-19 DIAGNOSIS — L821 Other seborrheic keratosis: Secondary | ICD-10-CM | POA: Diagnosis not present

## 2024-05-19 DIAGNOSIS — L570 Actinic keratosis: Secondary | ICD-10-CM | POA: Diagnosis not present

## 2024-05-19 DIAGNOSIS — Z85828 Personal history of other malignant neoplasm of skin: Secondary | ICD-10-CM | POA: Diagnosis not present

## 2024-05-20 DIAGNOSIS — R200 Anesthesia of skin: Secondary | ICD-10-CM | POA: Diagnosis not present

## 2024-05-20 DIAGNOSIS — R4181 Age-related cognitive decline: Secondary | ICD-10-CM | POA: Diagnosis not present

## 2024-05-20 DIAGNOSIS — M5412 Radiculopathy, cervical region: Secondary | ICD-10-CM | POA: Diagnosis not present

## 2024-05-20 DIAGNOSIS — Z818 Family history of other mental and behavioral disorders: Secondary | ICD-10-CM | POA: Diagnosis not present

## 2024-06-09 ENCOUNTER — Ambulatory Visit
Admission: RE | Admit: 2024-06-09 | Discharge: 2024-06-09 | Disposition: A | Discharge status: Home / Self Care | Source: Ambulatory Visit | Attending: Acute Care | Admitting: Acute Care

## 2024-06-09 DIAGNOSIS — F17210 Nicotine dependence, cigarettes, uncomplicated: Secondary | ICD-10-CM

## 2024-06-09 DIAGNOSIS — Z122 Encounter for screening for malignant neoplasm of respiratory organs: Secondary | ICD-10-CM

## 2024-06-09 DIAGNOSIS — Z87891 Personal history of nicotine dependence: Secondary | ICD-10-CM

## 2024-06-14 ENCOUNTER — Other Ambulatory Visit: Payer: Self-pay

## 2024-06-14 DIAGNOSIS — Z122 Encounter for screening for malignant neoplasm of respiratory organs: Secondary | ICD-10-CM

## 2024-06-14 DIAGNOSIS — F17210 Nicotine dependence, cigarettes, uncomplicated: Secondary | ICD-10-CM

## 2024-06-14 DIAGNOSIS — Z87891 Personal history of nicotine dependence: Secondary | ICD-10-CM

## 2024-06-22 DIAGNOSIS — Z23 Encounter for immunization: Secondary | ICD-10-CM | POA: Diagnosis not present

## 2024-06-22 DIAGNOSIS — R7303 Prediabetes: Secondary | ICD-10-CM | POA: Diagnosis not present

## 2024-06-22 DIAGNOSIS — Z125 Encounter for screening for malignant neoplasm of prostate: Secondary | ICD-10-CM | POA: Diagnosis not present

## 2024-06-22 DIAGNOSIS — Z0000 Encounter for general adult medical examination without abnormal findings: Secondary | ICD-10-CM | POA: Diagnosis not present

## 2024-06-22 DIAGNOSIS — R946 Abnormal results of thyroid function studies: Secondary | ICD-10-CM | POA: Diagnosis not present

## 2024-06-22 DIAGNOSIS — E7800 Pure hypercholesterolemia, unspecified: Secondary | ICD-10-CM | POA: Diagnosis not present

## 2024-06-22 DIAGNOSIS — Z1331 Encounter for screening for depression: Secondary | ICD-10-CM | POA: Diagnosis not present

## 2024-06-22 DIAGNOSIS — N529 Male erectile dysfunction, unspecified: Secondary | ICD-10-CM | POA: Diagnosis not present

## 2024-06-24 ENCOUNTER — Other Ambulatory Visit
